# Patient Record
Sex: Male | Born: 1980 | Race: White | Hispanic: No | Marital: Single | Smoking: Current every day smoker
Health system: Southern US, Community
[De-identification: ages and names within clinical notes are randomized; demographics above are authoritative.]

## PROBLEM LIST (undated history)

## (undated) DIAGNOSIS — N200 Calculus of kidney: Secondary | ICD-10-CM

## (undated) DIAGNOSIS — K0889 Other specified disorders of teeth and supporting structures: Secondary | ICD-10-CM

## (undated) DIAGNOSIS — I10 Essential (primary) hypertension: Secondary | ICD-10-CM

## (undated) DIAGNOSIS — K279 Peptic ulcer, site unspecified, unspecified as acute or chronic, without hemorrhage or perforation: Secondary | ICD-10-CM

---

## 2000-12-24 ENCOUNTER — Emergency Department (HOSPITAL_COMMUNITY): Admission: EM | Admit: 2000-12-24 | Discharge: 2000-12-24 | Payer: Self-pay | Admitting: Emergency Medicine

## 2001-03-19 ENCOUNTER — Emergency Department (HOSPITAL_COMMUNITY): Admission: EM | Admit: 2001-03-19 | Discharge: 2001-03-19 | Payer: Self-pay | Admitting: Emergency Medicine

## 2001-03-27 ENCOUNTER — Emergency Department (HOSPITAL_COMMUNITY): Admission: EM | Admit: 2001-03-27 | Discharge: 2001-03-27 | Payer: Self-pay | Admitting: *Deleted

## 2001-04-02 ENCOUNTER — Emergency Department (HOSPITAL_COMMUNITY): Admission: EM | Admit: 2001-04-02 | Discharge: 2001-04-02 | Payer: Self-pay | Admitting: *Deleted

## 2001-11-01 ENCOUNTER — Emergency Department (HOSPITAL_COMMUNITY): Admission: EM | Admit: 2001-11-01 | Discharge: 2001-11-01 | Payer: Self-pay | Admitting: Emergency Medicine

## 2002-06-01 ENCOUNTER — Emergency Department (HOSPITAL_COMMUNITY): Admission: EM | Admit: 2002-06-01 | Discharge: 2002-06-01 | Payer: Self-pay | Admitting: Emergency Medicine

## 2002-09-15 ENCOUNTER — Emergency Department (HOSPITAL_COMMUNITY): Admission: EM | Admit: 2002-09-15 | Discharge: 2002-09-15 | Payer: Self-pay | Admitting: Emergency Medicine

## 2002-09-15 ENCOUNTER — Encounter: Payer: Self-pay | Admitting: Emergency Medicine

## 2004-02-24 ENCOUNTER — Emergency Department (HOSPITAL_COMMUNITY): Admission: EM | Admit: 2004-02-24 | Discharge: 2004-02-24 | Payer: Self-pay | Admitting: Emergency Medicine

## 2004-08-30 ENCOUNTER — Emergency Department (HOSPITAL_COMMUNITY): Admission: EM | Admit: 2004-08-30 | Discharge: 2004-08-30 | Payer: Self-pay | Admitting: Emergency Medicine

## 2005-02-07 ENCOUNTER — Emergency Department (HOSPITAL_COMMUNITY): Admission: EM | Admit: 2005-02-07 | Discharge: 2005-02-07 | Payer: Self-pay | Admitting: Emergency Medicine

## 2005-04-01 ENCOUNTER — Emergency Department (HOSPITAL_COMMUNITY): Admission: EM | Admit: 2005-04-01 | Discharge: 2005-04-01 | Payer: Self-pay | Admitting: Emergency Medicine

## 2005-04-04 ENCOUNTER — Emergency Department (HOSPITAL_COMMUNITY): Admission: EM | Admit: 2005-04-04 | Discharge: 2005-04-04 | Payer: Self-pay | Admitting: Emergency Medicine

## 2005-05-03 ENCOUNTER — Emergency Department (HOSPITAL_COMMUNITY): Admission: EM | Admit: 2005-05-03 | Discharge: 2005-05-03 | Payer: Self-pay | Admitting: Emergency Medicine

## 2005-07-04 ENCOUNTER — Emergency Department (HOSPITAL_COMMUNITY): Admission: EM | Admit: 2005-07-04 | Discharge: 2005-07-04 | Payer: Self-pay | Admitting: Emergency Medicine

## 2005-09-10 ENCOUNTER — Emergency Department (HOSPITAL_COMMUNITY): Admission: EM | Admit: 2005-09-10 | Discharge: 2005-09-10 | Payer: Self-pay | Admitting: Emergency Medicine

## 2005-10-23 ENCOUNTER — Emergency Department (HOSPITAL_COMMUNITY): Admission: EM | Admit: 2005-10-23 | Discharge: 2005-10-24 | Payer: Self-pay | Admitting: Emergency Medicine

## 2006-01-02 ENCOUNTER — Emergency Department (HOSPITAL_COMMUNITY): Admission: EM | Admit: 2006-01-02 | Discharge: 2006-01-02 | Payer: Self-pay | Admitting: Emergency Medicine

## 2006-02-01 ENCOUNTER — Emergency Department (HOSPITAL_COMMUNITY): Admission: EM | Admit: 2006-02-01 | Discharge: 2006-02-01 | Payer: Self-pay | Admitting: Emergency Medicine

## 2006-03-05 ENCOUNTER — Emergency Department (HOSPITAL_COMMUNITY): Admission: EM | Admit: 2006-03-05 | Discharge: 2006-03-05 | Payer: Self-pay | Admitting: Emergency Medicine

## 2006-03-30 ENCOUNTER — Emergency Department (HOSPITAL_COMMUNITY): Admission: EM | Admit: 2006-03-30 | Discharge: 2006-03-30 | Payer: Self-pay | Admitting: Emergency Medicine

## 2006-04-25 ENCOUNTER — Emergency Department (HOSPITAL_COMMUNITY): Admission: EM | Admit: 2006-04-25 | Discharge: 2006-04-25 | Payer: Self-pay | Admitting: Emergency Medicine

## 2006-05-16 ENCOUNTER — Emergency Department (HOSPITAL_COMMUNITY): Admission: EM | Admit: 2006-05-16 | Discharge: 2006-05-16 | Payer: Self-pay | Admitting: Emergency Medicine

## 2006-06-09 ENCOUNTER — Emergency Department (HOSPITAL_COMMUNITY): Admission: EM | Admit: 2006-06-09 | Discharge: 2006-06-09 | Payer: Self-pay | Admitting: Emergency Medicine

## 2006-06-27 ENCOUNTER — Emergency Department (HOSPITAL_COMMUNITY): Admission: EM | Admit: 2006-06-27 | Discharge: 2006-06-28 | Payer: Self-pay | Admitting: Emergency Medicine

## 2006-07-09 ENCOUNTER — Emergency Department (HOSPITAL_COMMUNITY): Admission: EM | Admit: 2006-07-09 | Discharge: 2006-07-09 | Payer: Self-pay | Admitting: Emergency Medicine

## 2006-07-29 ENCOUNTER — Emergency Department (HOSPITAL_COMMUNITY): Admission: EM | Admit: 2006-07-29 | Discharge: 2006-07-29 | Payer: Self-pay | Admitting: Emergency Medicine

## 2006-08-22 ENCOUNTER — Emergency Department (HOSPITAL_COMMUNITY): Admission: EM | Admit: 2006-08-22 | Discharge: 2006-08-23 | Payer: Self-pay | Admitting: Emergency Medicine

## 2006-09-02 ENCOUNTER — Emergency Department (HOSPITAL_COMMUNITY): Admission: EM | Admit: 2006-09-02 | Discharge: 2006-09-02 | Payer: Self-pay | Admitting: Emergency Medicine

## 2006-11-23 ENCOUNTER — Emergency Department (HOSPITAL_COMMUNITY): Admission: EM | Admit: 2006-11-23 | Discharge: 2006-11-24 | Payer: Self-pay | Admitting: Emergency Medicine

## 2007-01-07 ENCOUNTER — Emergency Department (HOSPITAL_COMMUNITY): Admission: EM | Admit: 2007-01-07 | Discharge: 2007-01-07 | Payer: Self-pay | Admitting: Emergency Medicine

## 2007-01-23 ENCOUNTER — Emergency Department (HOSPITAL_COMMUNITY): Admission: EM | Admit: 2007-01-23 | Discharge: 2007-01-23 | Payer: Self-pay | Admitting: *Deleted

## 2007-03-01 ENCOUNTER — Emergency Department (HOSPITAL_COMMUNITY): Admission: EM | Admit: 2007-03-01 | Discharge: 2007-03-01 | Payer: Self-pay | Admitting: Emergency Medicine

## 2007-03-15 ENCOUNTER — Emergency Department (HOSPITAL_COMMUNITY): Admission: EM | Admit: 2007-03-15 | Discharge: 2007-03-15 | Payer: Self-pay | Admitting: Emergency Medicine

## 2007-04-02 ENCOUNTER — Emergency Department (HOSPITAL_COMMUNITY): Admission: EM | Admit: 2007-04-02 | Discharge: 2007-04-02 | Payer: Self-pay | Admitting: Emergency Medicine

## 2007-04-22 ENCOUNTER — Emergency Department (HOSPITAL_COMMUNITY): Admission: EM | Admit: 2007-04-22 | Discharge: 2007-04-22 | Payer: Self-pay | Admitting: Emergency Medicine

## 2007-06-21 ENCOUNTER — Emergency Department (HOSPITAL_COMMUNITY): Admission: EM | Admit: 2007-06-21 | Discharge: 2007-06-21 | Payer: Self-pay | Admitting: Emergency Medicine

## 2007-06-29 ENCOUNTER — Emergency Department (HOSPITAL_COMMUNITY): Admission: EM | Admit: 2007-06-29 | Discharge: 2007-06-29 | Payer: Self-pay | Admitting: Emergency Medicine

## 2007-08-11 ENCOUNTER — Emergency Department (HOSPITAL_COMMUNITY): Admission: EM | Admit: 2007-08-11 | Discharge: 2007-08-11 | Payer: Self-pay | Admitting: Emergency Medicine

## 2007-08-12 IMAGING — CR DG HIP (WITH OR WITHOUT PELVIS) 2-3V*L*
3 series · 3 of 3 positions shown · non-contrast
Comparison: none

CLINICAL DATA: Injury to left hip with pain. 
 LEFT HIP ? 3 VIEW:

[view not recorded (1 of 3)]
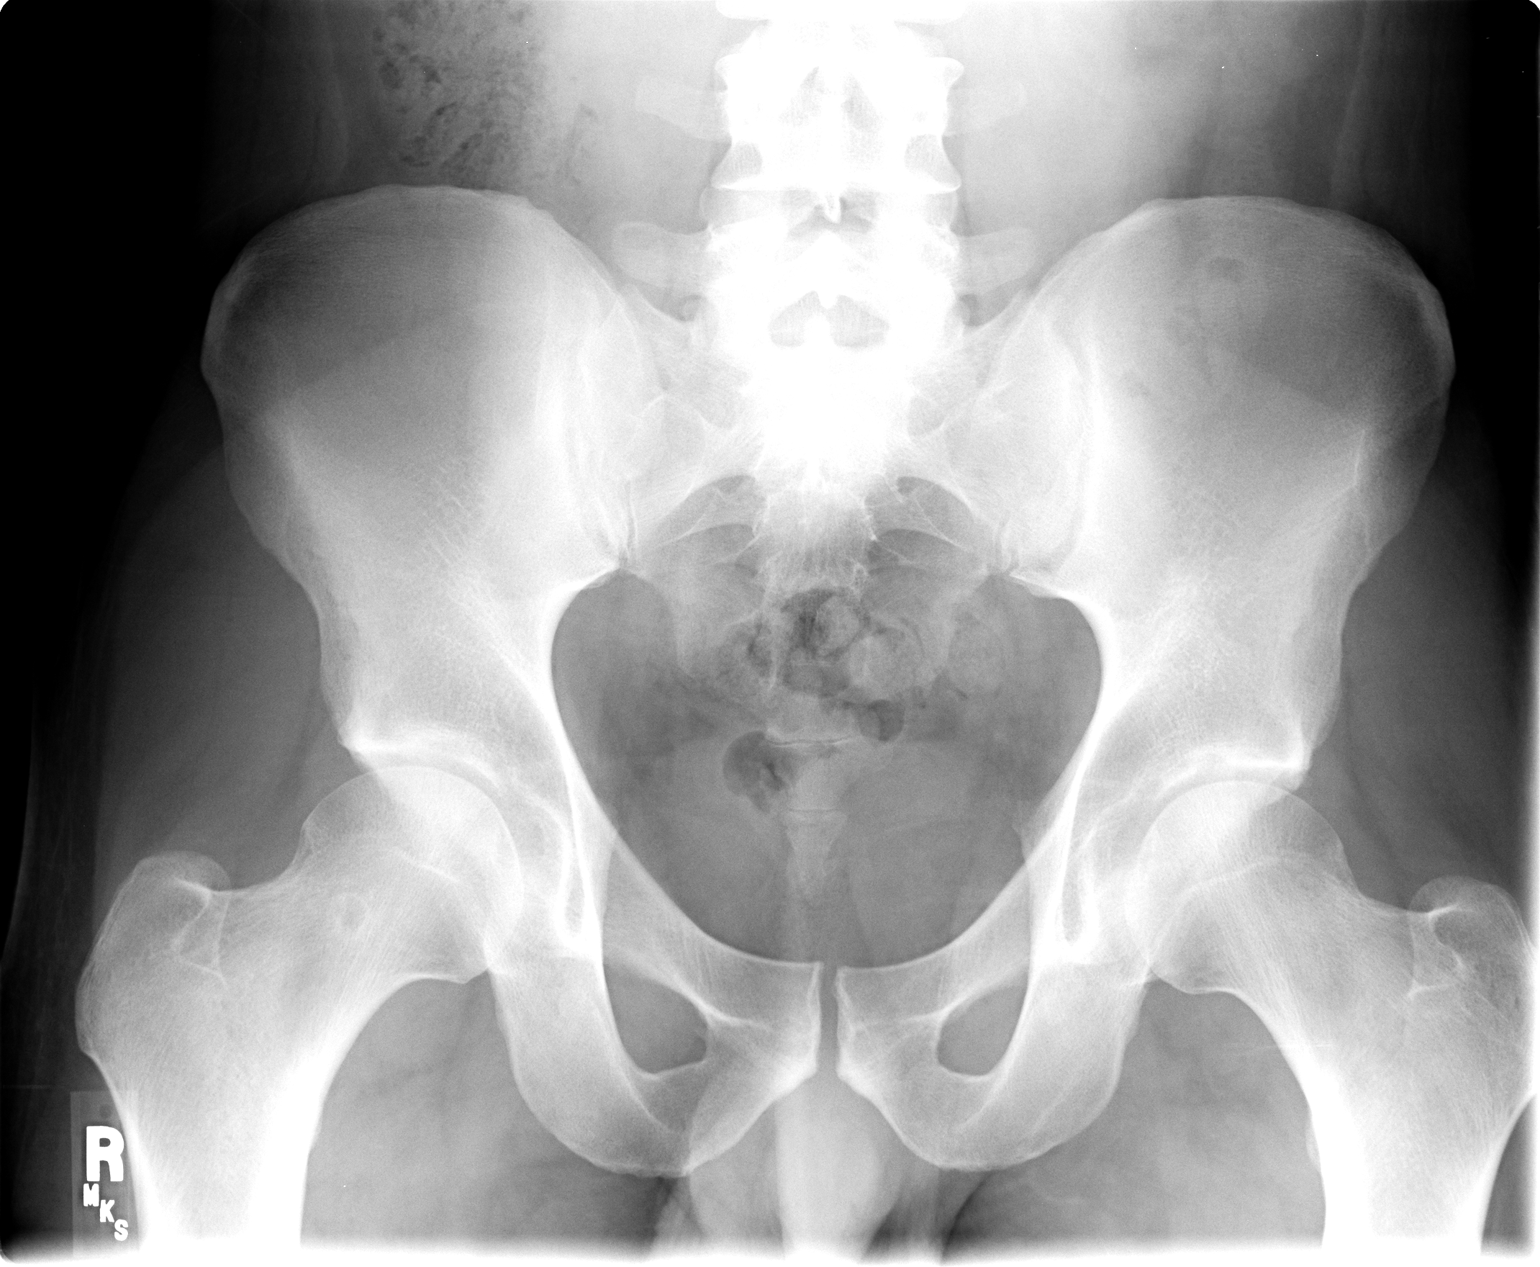

[view not recorded (2 of 3)]
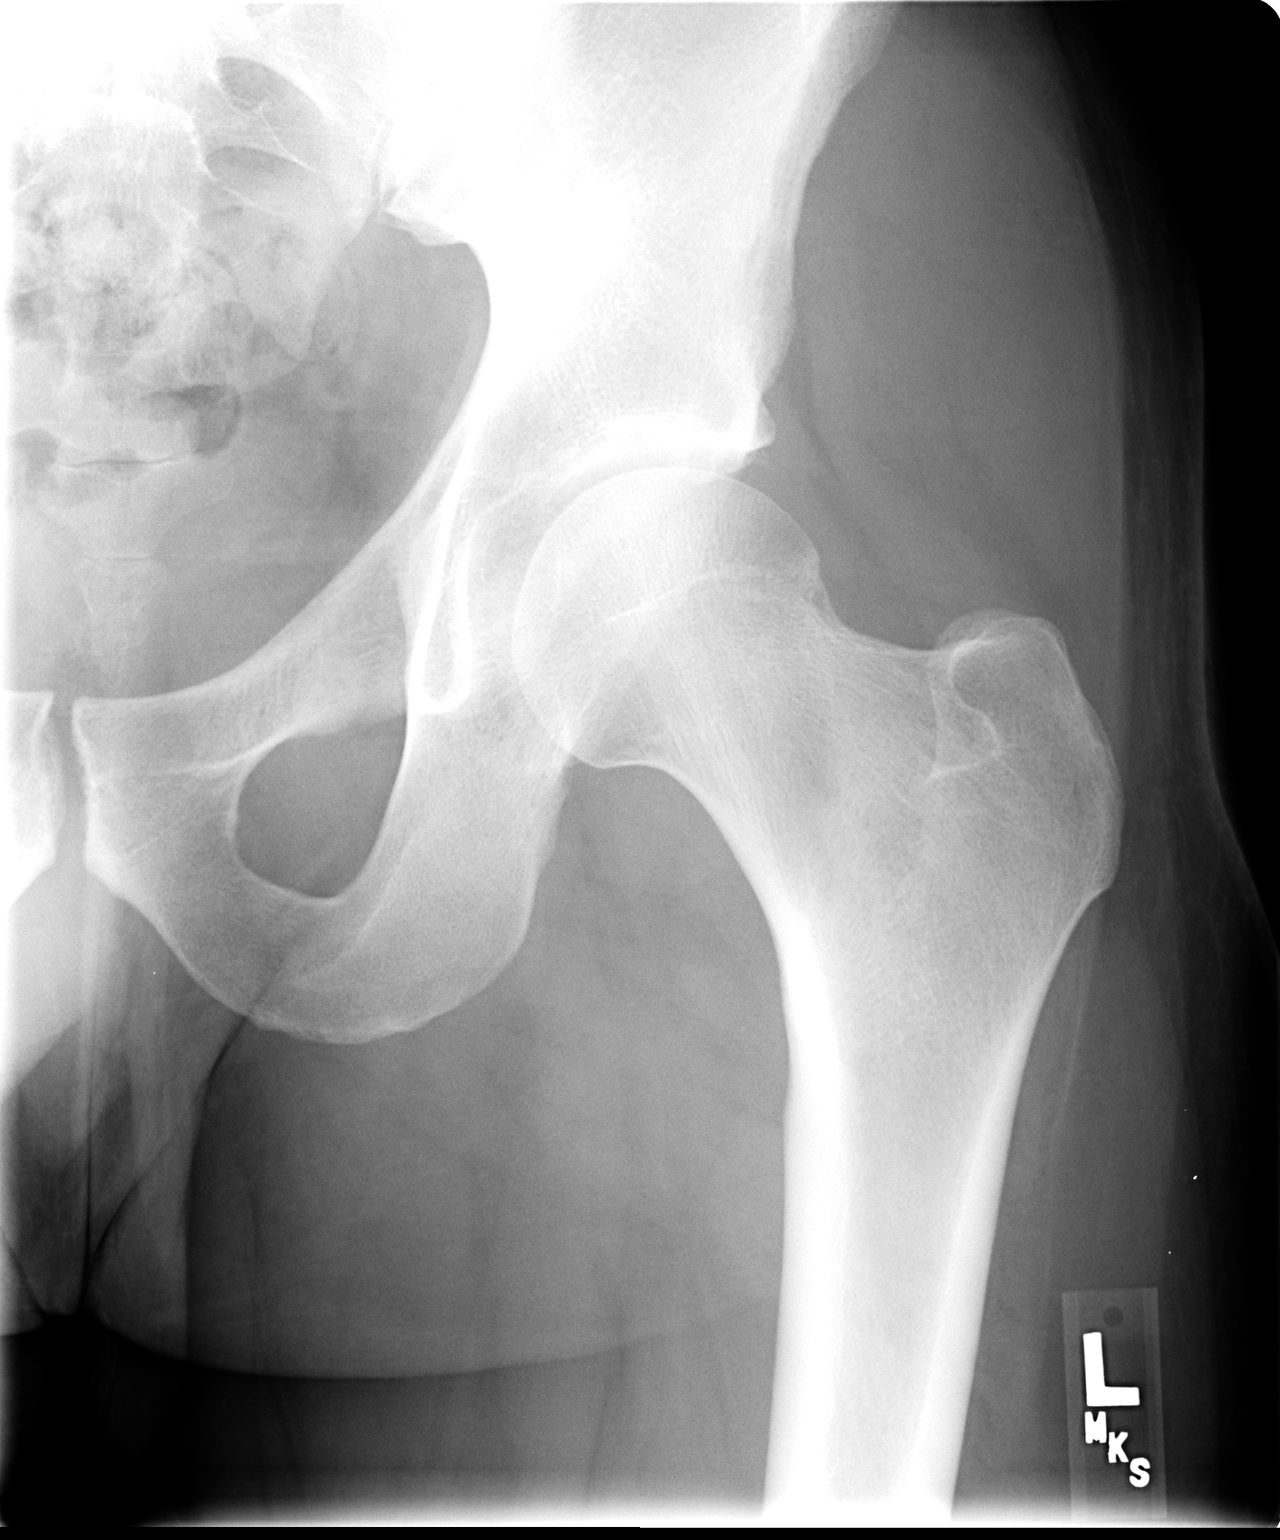

[view not recorded (3 of 3)]
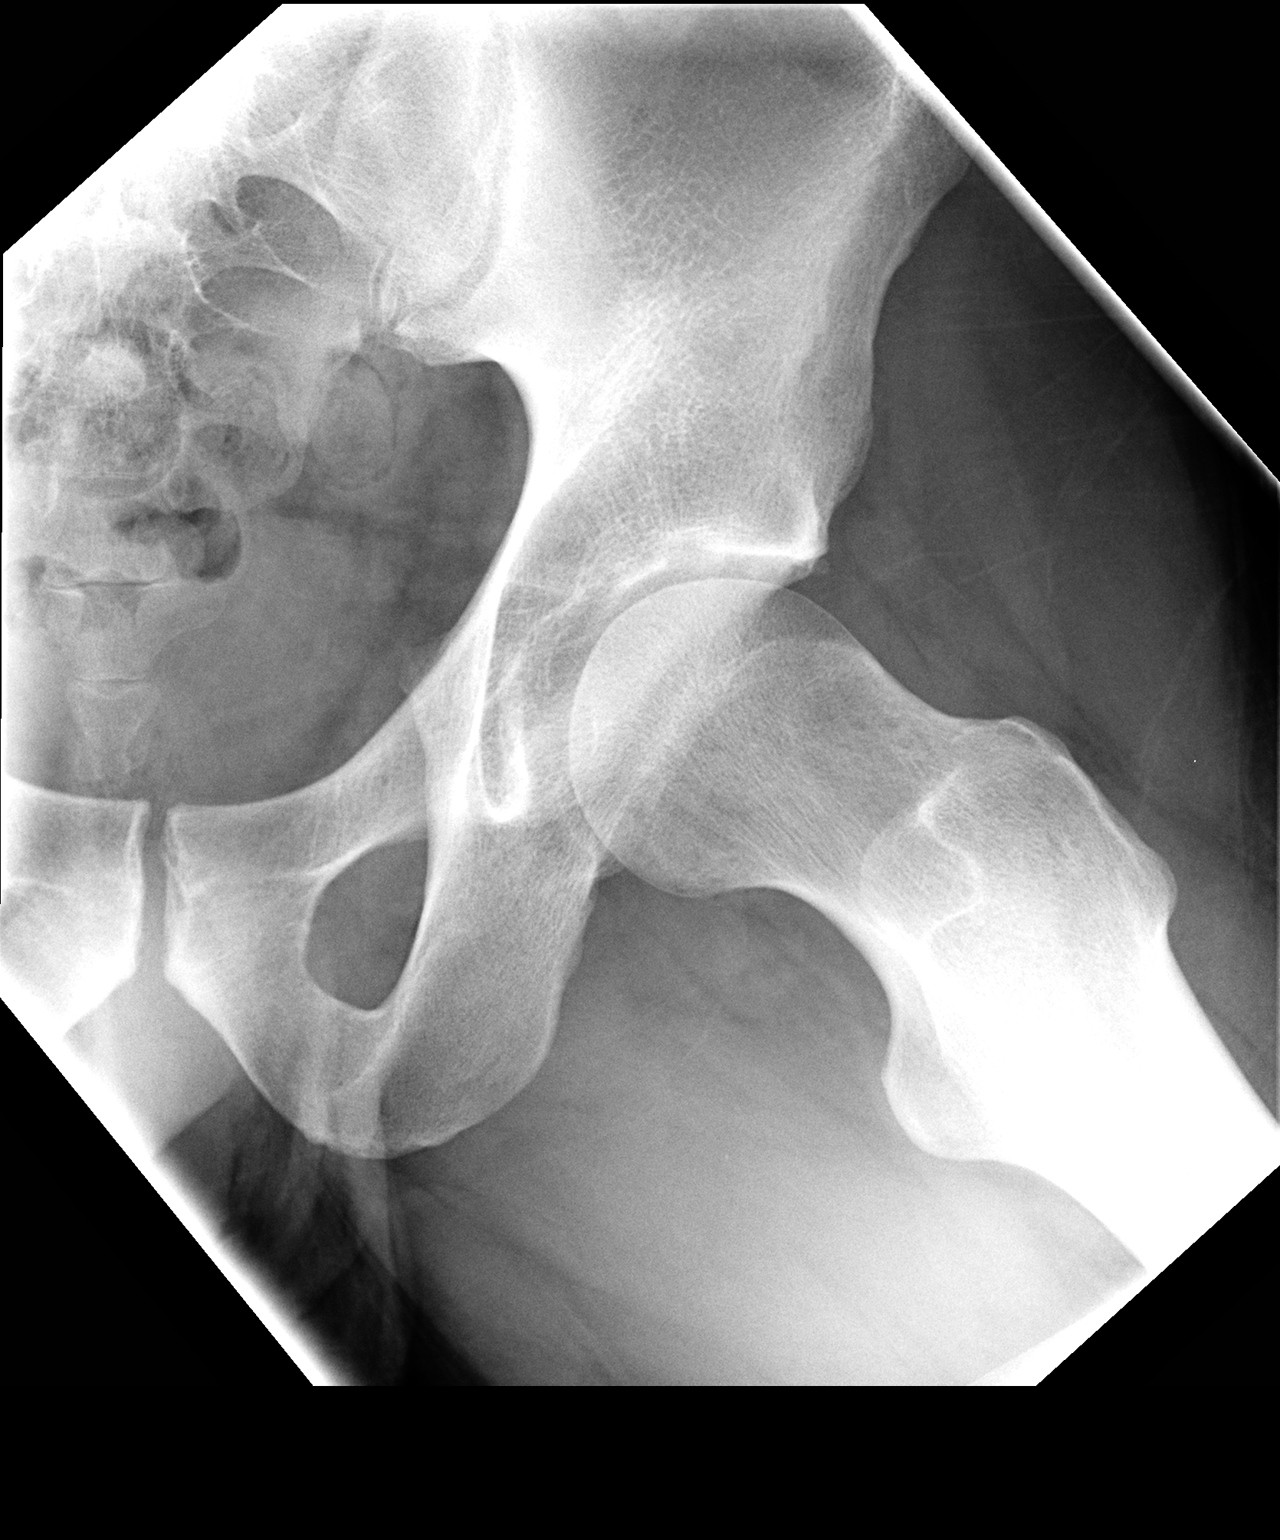

[3 of 3 positions shown; findings below may reference images not displayed]

FINDINGS: There is no evidence of hip fracture or dislocation.  There is no evidence of arthropathy or other focal bone abnormality.
IMPRESSION: Negative.

## 2007-11-07 ENCOUNTER — Emergency Department (HOSPITAL_COMMUNITY): Admission: EM | Admit: 2007-11-07 | Discharge: 2007-11-07 | Payer: Self-pay | Admitting: Emergency Medicine

## 2008-02-18 ENCOUNTER — Emergency Department (HOSPITAL_COMMUNITY): Admission: EM | Admit: 2008-02-18 | Discharge: 2008-02-18 | Payer: Self-pay | Admitting: Emergency Medicine

## 2008-02-21 ENCOUNTER — Emergency Department (HOSPITAL_COMMUNITY): Admission: EM | Admit: 2008-02-21 | Discharge: 2008-02-21 | Payer: Self-pay | Admitting: Emergency Medicine

## 2008-02-28 ENCOUNTER — Emergency Department (HOSPITAL_COMMUNITY): Admission: EM | Admit: 2008-02-28 | Discharge: 2008-02-28 | Payer: Self-pay | Admitting: Emergency Medicine

## 2008-03-18 ENCOUNTER — Emergency Department (HOSPITAL_COMMUNITY): Admission: EM | Admit: 2008-03-18 | Discharge: 2008-03-18 | Payer: Self-pay | Admitting: Emergency Medicine

## 2008-04-22 ENCOUNTER — Emergency Department (HOSPITAL_COMMUNITY): Admission: EM | Admit: 2008-04-22 | Discharge: 2008-04-23 | Payer: Self-pay | Admitting: Emergency Medicine

## 2008-05-12 ENCOUNTER — Emergency Department (HOSPITAL_COMMUNITY): Admission: EM | Admit: 2008-05-12 | Discharge: 2008-05-12 | Payer: Self-pay | Admitting: Emergency Medicine

## 2008-06-08 ENCOUNTER — Emergency Department (HOSPITAL_COMMUNITY): Admission: EM | Admit: 2008-06-08 | Discharge: 2008-06-08 | Payer: Self-pay | Admitting: Emergency Medicine

## 2008-06-30 ENCOUNTER — Emergency Department (HOSPITAL_COMMUNITY): Admission: EM | Admit: 2008-06-30 | Discharge: 2008-06-30 | Payer: Self-pay | Admitting: Emergency Medicine

## 2008-08-17 ENCOUNTER — Emergency Department (HOSPITAL_COMMUNITY): Admission: EM | Admit: 2008-08-17 | Discharge: 2008-08-17 | Payer: Self-pay | Admitting: Emergency Medicine

## 2008-10-20 ENCOUNTER — Emergency Department (HOSPITAL_COMMUNITY): Admission: EM | Admit: 2008-10-20 | Discharge: 2008-10-20 | Payer: Self-pay | Admitting: Emergency Medicine

## 2008-11-30 ENCOUNTER — Emergency Department (HOSPITAL_COMMUNITY): Admission: EM | Admit: 2008-11-30 | Discharge: 2008-11-30 | Payer: Self-pay | Admitting: Emergency Medicine

## 2009-04-03 ENCOUNTER — Emergency Department (HOSPITAL_COMMUNITY): Admission: EM | Admit: 2009-04-03 | Discharge: 2009-04-03 | Payer: Self-pay | Admitting: Emergency Medicine

## 2009-08-16 ENCOUNTER — Emergency Department (HOSPITAL_COMMUNITY): Admission: EM | Admit: 2009-08-16 | Discharge: 2009-08-16 | Payer: Self-pay | Admitting: Emergency Medicine

## 2009-10-10 ENCOUNTER — Emergency Department (HOSPITAL_COMMUNITY): Admission: EM | Admit: 2009-10-10 | Discharge: 2009-10-10 | Payer: Self-pay | Admitting: Emergency Medicine

## 2009-10-20 ENCOUNTER — Emergency Department (HOSPITAL_COMMUNITY): Admission: EM | Admit: 2009-10-20 | Discharge: 2009-10-20 | Payer: Self-pay | Admitting: Pain Medicine

## 2009-10-20 ENCOUNTER — Encounter: Payer: Self-pay | Admitting: Orthopedic Surgery

## 2009-10-27 ENCOUNTER — Ambulatory Visit: Payer: Self-pay | Admitting: Orthopedic Surgery

## 2009-10-27 DIAGNOSIS — S838X9A Sprain of other specified parts of unspecified knee, initial encounter: Secondary | ICD-10-CM

## 2009-10-27 DIAGNOSIS — S86819A Strain of other muscle(s) and tendon(s) at lower leg level, unspecified leg, initial encounter: Secondary | ICD-10-CM

## 2009-11-17 ENCOUNTER — Ambulatory Visit: Payer: Self-pay | Admitting: Orthopedic Surgery

## 2009-12-20 ENCOUNTER — Ambulatory Visit: Payer: Self-pay | Admitting: Orthopedic Surgery

## 2010-01-11 ENCOUNTER — Encounter: Payer: Self-pay | Admitting: Orthopedic Surgery

## 2010-01-20 ENCOUNTER — Encounter (HOSPITAL_COMMUNITY): Admission: RE | Admit: 2010-01-20 | Payer: Self-pay | Admitting: Orthopedic Surgery

## 2010-02-02 ENCOUNTER — Emergency Department (HOSPITAL_COMMUNITY): Admission: EM | Admit: 2010-02-02 | Discharge: 2010-02-02 | Payer: Self-pay | Admitting: Emergency Medicine

## 2010-02-08 ENCOUNTER — Emergency Department (HOSPITAL_COMMUNITY): Admission: EM | Admit: 2010-02-08 | Discharge: 2010-02-08 | Payer: Self-pay | Admitting: Emergency Medicine

## 2010-03-14 ENCOUNTER — Emergency Department (HOSPITAL_COMMUNITY)
Admission: EM | Admit: 2010-03-14 | Discharge: 2010-03-14 | Payer: Self-pay | Source: Home / Self Care | Admitting: Emergency Medicine

## 2010-04-19 NOTE — Miscellaneous (Signed)
Summary: Rehabilitation Referral form  Rehabilitation Referral form   Imported By: Jacklynn Ganong 01/13/2010 07:55:00  _____________________________________________________________________  External Attachment:    Type:   Image     Comment:   External Document

## 2010-04-19 NOTE — Assessment & Plan Note (Signed)
Summary: [facility] FOL/UP/LT LEG INJURY/XR [facility] 10/20/09/SELF PAY/ADVISED $100...   Vital Signs:  Patient profile:   30 year old male Height:      72 inches Weight:      200 pounds Pulse rate:   68 / minute Resp:     16 per minute  Vitals Entered By: Fuller Canada MD (October 27, 2009 3:47 PM)  Visit Type:  new patient Referring Provider:  [facility] er Primary Provider:  na  CC:  left thigh pain.  History of Present Illness: I saw Jeremy Nichols in the office today for an initial visit.  He is a 30 years old man with the complaint of:  left thigh pain.  DOI 10/19/09 was playing baseball, was running and started having pain and swelling left thigh.  Xrays [facility] left femur 10/20/09.  Meds: Ibuprofen 400mg  and Flexeril.  Patient presents in severe pain with sharp stabbing constant pain in this mid to upper thigh and swelling around his knee.  So far he's had some ibuprofen and Flexeril he is having very significant problems ambulating except with crutches and aching not relieved flex his knee well.    Allergies (verified): 1)  ! Ultram  Past History:  Past Medical History: na  Past Surgical History: na  Family History: Family History of Arthritis  Social History: Patient is separated.  carpentry house remodeling smokes 1/2 ppd no alcohol 2 sodas per day 9th grade ed.  Review of Systems Constitutional:  Denies weight loss, weight gain, fever, chills, and fatigue. Cardiovascular:  Denies chest pain, palpitations, fainting, and murmurs. Respiratory:  Denies short of breath, wheezing, couch, tightness, pain on inspiration, and snoring . Gastrointestinal:  Denies heartburn, nausea, vomiting, diarrhea, constipation, and blood in your stools. Genitourinary:  Denies frequency, urgency, difficulty urinating, painful urination, flank pain, and bleeding in urine. Neurologic:  Denies numbness, tingling, unsteady gait, dizziness, tremors, and seizure. Musculoskeletal:  Complains of  swelling, instability, stiffness, redness, heat, and muscle pain; denies joint pain. Endocrine:  Denies excessive thirst, exessive urination, and heat or cold intolerance. Psychiatric:  Denies nervousness, depression, anxiety, and hallucinations. Skin:  Denies changes in the skin, poor healing, rash, itching, and redness. HEENT:  Denies blurred or double vision, eye pain, redness, and watering. Immunology:  Denies seasonal allergies, sinus problems, and allergic to bee stings. Hemoatologic:  Denies easy bleeding and brusing.  Physical Exam  Additional Exam:  GEN: well developed, well nourished, normal grooming and hygiene, no deformity and normal body habitus.   CDV: pulses are normal, no edema, no erythema. no tenderness  Lymph: normal lymph nodes   Skin: no rashes, skin lesions or open sores   NEURO: normal coordination, reflexes, sensation.   Psyche: awake, alert and oriented. Mood normal   Gait: abnormals of significance support needed with crutches to ambulate  He has tenderness in his mid thigh with a mass there consistent with a muscle tear is tenderness proximally also near the anterior superior iliac spine there is swelling around the knee and is painful flexion passively and actively measuring only 60 there is no palpable defect at the quadriceps insertion.  He does have active extension and active elevation of the limb with a mild extensor lag       Impression & Recommendations:  Problem # 1:  SPRAIN&STRAIN OTHER SPECIFIED SITES KNEE&LEG (ICD-844.8) Assessment New  I think this can best be described as a extensor mechanism tear involving either the sartorius or rectus femoris more likely rectus femoris they saw  location.  Recommend supportive care.  Orders: New Patient Level III (89381)  Medications Added to Medication List This Visit: 1)  Ibuprofen 800 Mg Tabs (Ibuprofen) .Marland Kitchen.. 1 by mouth three times a day 2)  Norco 7.5-325 Mg Tabs (Hydrocodone-acetaminophen)  .Marland Kitchen.. 1 by mouth q 4 as needed pain 3)  Methocarbamol 500 Mg Tabs (Methocarbamol) .Marland Kitchen.. 1 q 6 as needed pain spasm  Patient Instructions: 1)  WEAR BRACE X 3 weeks and use the crutches  2)  Take 800 mg IBUPROFEN and 7.5 MG norco 3)  Apply heat 30 min three times a day to the thigh  4)  return in 3 weeks  Prescriptions: METHOCARBAMOL 500 MG TABS (METHOCARBAMOL) 1 q 6 as needed pain spasm  #28 x 2   Entered and Authorized by:   Fuller Canada MD   Signed by:   Fuller Canada MD on 10/27/2009   Method used:   Print then Give to Patient   RxID:   [id number] NORCO 7.5-325 MG TABS (HYDROCODONE-ACETAMINOPHEN) 1 by mouth q 4 as needed pain  #42 x 2   Entered and Authorized by:   Fuller Canada MD   Signed by:   Fuller Canada MD on 10/27/2009   Method used:   Print then Give to Patient   RxID:   [id number] IBUPROFEN 800 MG TABS (IBUPROFEN) 1 by mouth three times a day  #90 x 2   Entered and Authorized by:   Fuller Canada MD   Signed by:   Fuller Canada MD on 10/27/2009   Method used:   Print then Give to Patient   RxID:   [id number]

## 2010-04-19 NOTE — Assessment & Plan Note (Signed)
Summary: 3 WK RE-CK LT LEG/[facility] DISCOUNT/CAF   Visit Type:  Follow-up Referring Provider:  [facility] er Primary Provider:  na  CC:  recheck left leg.  History of Present Illness: 30 year old male injured his LEFT quadriceps most likely a rectus femoris or sartorius muscle tear  Date of injury was October 19, 2009  He has made some improvement still has some anterior quadriceps pain at 4 weeks  Xrays [facility] left femur 10/20/09. negative bony injury x-ray  Medications: 1)  Ibuprofen 800 Mg Tabs (Ibuprofen) .Marland Kitchen.. 1 by mouth three times a day 2)  Norco 7.5-325 Mg Tabs (Hydrocodone-acetaminophen) .Marland Kitchen.. 1 by mouth q 4 as needed pain 3)  Methocarbamol 500 Mg Tabs (Methocarbamol) .Marland Kitchen.. 1 q 6 as needed pain spasm  I last saw him 3 weeks ago his initial injury was August 2 as stated  It makes note that he is making improvements but is not quite 100% he still has some pain at the front of his thigh he would like his pain medicines were filled    Allergies: 1)  ! Ultram  Physical Exam  Additional Exam:  exam shows that he ambulates now without assistive device he has tenderness in the anterior portion of the thigh he has regained full range of motion in his hip and knee his skin is intact sensation is normal he is awake and alert there is weakness in hip flexion as well as knee extension     Impression & Recommendations:  Problem # 1:  SPRAIN&STRAIN OTHER SPECIFIED SITES KNEE&LEG (ICD-844.8) Assessment Improved he should now be able to go into a physical therapy program, all medications were rent in Orders: Physical Therapy Referral (PT) Est. Patient Level III (16109) d I'll see him again in 6 weeks all medicines were reviewed  Patient Instructions: 1)  Come back in 6 weeks 2)  take meds as prescibed 3)  go for therapy for 6 weeks Prescriptions: METHOCARBAMOL 500 MG TABS (METHOCARBAMOL) 1 q 6 as needed pain spasm  #90 x 1   Entered and Authorized by:   Fuller Canada MD   Signed by:    Fuller Canada MD on 12/20/2009   Method used:   Print then Give to Patient   RxID:   [id number] NORCO 7.5-325 MG TABS (HYDROCODONE-ACETAMINOPHEN) 1 by mouth q 4 as needed pain  #42 x 2   Entered and Authorized by:   Fuller Canada MD   Signed by:   Fuller Canada MD on 12/20/2009   Method used:   Print then Give to Patient   RxID:   [id number] IBUPROFEN 800 MG TABS (IBUPROFEN) 1 by mouth three times a day  #90 x 2   Entered and Authorized by:   Fuller Canada MD   Signed by:   Fuller Canada MD on 12/20/2009   Method used:   Print then Give to Patient   RxID:   [id number]

## 2010-04-19 NOTE — Letter (Signed)
Summary: History form  History form   Imported By: Jacklynn Ganong 11/02/2009 11:52:02  _____________________________________________________________________  External Attachment:    Type:   Image     Comment:   External Document

## 2010-04-19 NOTE — Assessment & Plan Note (Signed)
Summary: 3 WK RE-CK/LT LEG INJURY 10/20/09/SELF PAY VS.[facility]   Visit Type:  Follow-up Referring Provider:  ap er Primary Provider:  na  CC:  left leg.  History of Present Illness: 30 year old male injured his LEFT quadriceps most likely a rectus femoris or sartorius muscle tear  Date of injury was October 19, 2009  He has made some improvement still has some anterior quadriceps pain at 4 weeks  Xrays [facility] left femur 10/20/09. negative bony injury x-ray  Medications: 1)  Ibuprofen 800 Mg Tabs (Ibuprofen) .Marland Kitchen.. 1 by mouth three times a day 2)  Norco 7.5-325 Mg Tabs (Hydrocodone-acetaminophen) .Marland Kitchen.. 1 by mouth q 4 as needed pain 3)  Methocarbamol 500 Mg Tabs (Methocarbamol) .Marland Kitchen.. 1 q 6 as needed pain spasm     Allergies: 1)  ! Ultram  Physical Exam  Additional Exam:  left eye still tender but not as much, swelling is down.  Range of motion LEFT knee 0-50.  LEFT knee is stable hip range of motion painful in flexion but full  No neurovascular deficits     Impression & Recommendations:  Problem # 1:  SPRAIN&STRAIN OTHER SPECIFIED SITES KNEE&LEG (ICD-844.8)  rectus femoris strain tear  Continue range of motion exercises and bracing patient is uninsured and cannot get a hinged knee brace  The back 3 weeks to possibly remove brace after examination  Orders: Est. Patient Level II (47829)  Patient Instructions: 1)  Continue meds 2)  come back in 3 weeks Prescriptions: METHOCARBAMOL 500 MG TABS (METHOCARBAMOL) 1 q 6 as needed pain spasm  #90 x 0   Entered and Authorized by:   Fuller Canada MD   Signed by:   Fuller Canada MD on 11/17/2009   Method used:   Print then Give to Patient   RxID:   [id number] NORCO 7.5-325 MG TABS (HYDROCODONE-ACETAMINOPHEN) 1 by mouth q 4 as needed pain  #42 x 2   Entered and Authorized by:   Fuller Canada MD   Signed by:   Fuller Canada MD on 11/17/2009   Method used:   Print then Give to Patient   RxID:    [id number]

## 2010-05-31 LAB — URINE MICROSCOPIC-ADD ON

## 2010-05-31 LAB — URINALYSIS, ROUTINE W REFLEX MICROSCOPIC
Leukocytes, UA: NEGATIVE
Nitrite: NEGATIVE
Nitrite: NEGATIVE
Protein, ur: NEGATIVE mg/dL
Urobilinogen, UA: 0.2 mg/dL (ref 0.0–1.0)
Urobilinogen, UA: 0.2 mg/dL (ref 0.0–1.0)

## 2010-08-02 NOTE — Consult Note (Signed)
NAME:  ADE, STMARIE                ACCOUNT NO.:  [id number]   MEDICAL RECORD NO.:  [id number]          PATIENT TYPE:  EMS   LOCATION:  ED                            FACILITY:  [facility]   PHYSICIAN:  J. Darreld Mclean, M.D. DATE OF BIRTH:  Mar 23, 1980   DATE OF CONSULTATION:  DATE OF DISCHARGE:  08/17/2008                                 CONSULTATION   The patient is a 30 year old man seen at the request of ER staff.   He was doing some lifting today and injured his right shoulder.  He has  got marked pain and tenderness over the right shoulder, has markedly  decreased range of motion, forward flexion is only about 60 degrees,  internal rotation is about 15 and external 15, abductions test at 40-45,  which is very minimal.  Neurovascular is intact.  Extension is  approximately 10 degrees.  Adduction is limited to 30.  Grips are  normal.  Reflexes are normal.  It is very tender at the [facility] joint.  X-  rays of the shoulder are negative.  Range of motion of the neck is full.  There is no paresthesias.  There is some tightness on the right upper  trapezius.  Other extremities are negative.  He has a history of chronic  back pain and chronic sinus problems.  Back is negative today.  Vital  signs are normal.  Rest of the exam is negative.  Please refer to the ER  report established by the physician assistant here and cooperated by  reference, I have reviewed it.   IMPRESSION:  Acute right shoulder pain, AC sprain on the right.   He may have a rotator cuff, it is too early to say.  Right now he needs  elevation, ice, sling, and something for pain.  Prescriptions given for  Vicodin ES for pain.  I will see him in the office on Wednesday.  Use  the shoulder immobilizer.  Sleep semi erect.  Use ice.  Call if any  difficulty.  Return if any problem.  I will see him in the office on  Wednesday morning.           ______________________________  J. Darreld Mclean, M.D.     JWK/MEDQ  D:  08/17/2008   T:  08/18/2008  Job:  [id number]

## 2010-08-29 ENCOUNTER — Emergency Department (HOSPITAL_COMMUNITY)
Admission: EM | Admit: 2010-08-29 | Discharge: 2010-08-29 | Disposition: A | Discharge status: Home / Self Care | Payer: Self-pay | Attending: Emergency Medicine | Admitting: Emergency Medicine

## 2010-08-29 DIAGNOSIS — H6690 Otitis media, unspecified, unspecified ear: Secondary | ICD-10-CM | POA: Insufficient documentation

## 2010-09-15 ENCOUNTER — Emergency Department (HOSPITAL_COMMUNITY): Payer: Self-pay

## 2010-09-15 ENCOUNTER — Emergency Department (HOSPITAL_COMMUNITY)
Admission: EM | Admit: 2010-09-15 | Discharge: 2010-09-15 | Disposition: A | Discharge status: Home / Self Care | Payer: Self-pay | Attending: Emergency Medicine | Admitting: Emergency Medicine

## 2010-09-15 DIAGNOSIS — N201 Calculus of ureter: Secondary | ICD-10-CM | POA: Insufficient documentation

## 2010-09-15 DIAGNOSIS — R109 Unspecified abdominal pain: Secondary | ICD-10-CM | POA: Insufficient documentation

## 2010-09-15 LAB — URINALYSIS, ROUTINE W REFLEX MICROSCOPIC
Protein, ur: 100 mg/dL — AB
Urobilinogen, UA: 1 mg/dL (ref 0.0–1.0)

## 2010-09-15 LAB — URINE MICROSCOPIC-ADD ON

## 2010-09-17 LAB — URINE CULTURE: Colony Count: 10000

## 2010-12-14 LAB — URINALYSIS, ROUTINE W REFLEX MICROSCOPIC
Bilirubin Urine: NEGATIVE
Nitrite: NEGATIVE
Protein, ur: NEGATIVE
Specific Gravity, Urine: 1.02
Urobilinogen, UA: 0.2

## 2011-01-16 ENCOUNTER — Encounter: Payer: Self-pay | Admitting: *Deleted

## 2011-01-16 DIAGNOSIS — R109 Unspecified abdominal pain: Secondary | ICD-10-CM | POA: Insufficient documentation

## 2011-01-16 DIAGNOSIS — Z87442 Personal history of urinary calculi: Secondary | ICD-10-CM | POA: Insufficient documentation

## 2011-01-16 DIAGNOSIS — F17200 Nicotine dependence, unspecified, uncomplicated: Secondary | ICD-10-CM | POA: Insufficient documentation

## 2011-01-16 NOTE — ED Notes (Signed)
Pain in right lower quadrant 

## 2011-01-17 ENCOUNTER — Emergency Department (HOSPITAL_COMMUNITY)
Admission: EM | Admit: 2011-01-17 | Discharge: 2011-01-17 | Disposition: A | Discharge status: Home / Self Care | Payer: Self-pay | Attending: Emergency Medicine | Admitting: Emergency Medicine

## 2011-01-17 DIAGNOSIS — R109 Unspecified abdominal pain: Secondary | ICD-10-CM

## 2011-01-17 HISTORY — DX: Calculus of kidney: N20.0

## 2011-01-17 LAB — URINALYSIS, ROUTINE W REFLEX MICROSCOPIC
Bilirubin Urine: NEGATIVE
Glucose, UA: NEGATIVE mg/dL
Hgb urine dipstick: NEGATIVE
Ketones, ur: NEGATIVE mg/dL
Protein, ur: NEGATIVE mg/dL
Urobilinogen, UA: 0.2 mg/dL (ref 0.0–1.0)

## 2011-01-17 MED ORDER — HYDROCODONE-ACETAMINOPHEN 5-325 MG PO TABS: 1.0000 | ORAL_TABLET | ORAL | Status: AC | PRN

## 2011-01-17 MED ORDER — OXYCODONE-ACETAMINOPHEN 5-325 MG PO TABS
2.0000 | ORAL_TABLET | Freq: Once | ORAL | Status: AC
  Administered 2011-01-17: 2 via ORAL
  Filled 2011-01-17: qty 2

## 2011-01-17 MED ORDER — ONDANSETRON HCL 4 MG PO TABS: 8.0000 mg | ORAL_TABLET | Freq: Three times a day (TID) | ORAL | Status: AC | PRN

## 2011-01-17 NOTE — ED Notes (Signed)
Pt reports rt-sided abd/groin pain that began yesterday evening - pt states he was resting when the pain began. Admits to hx of kidney stones and states this pain feels the same. Denies n/v, fever, or hematuria. Pt resting comfortably on bed in no acute distress, family at bedside.

## 2011-01-17 NOTE — ED Provider Notes (Signed)
History     CSN: [id number] Arrival date & time: 01/17/2011 12:12 AM   First MD Initiated Contact with Patient 01/17/11 0014      Chief Complaint  Patient presents with  . Flank Pain    (Consider location/radiation/quality/duration/timing/severity/associated sxs/prior treatment) Patient is a 30 y.o. male presenting with flank pain. The history is provided by the patient.  Flank Pain The problem occurs hourly. Pertinent negatives include no chest pain. The symptoms are aggravated by nothing. The symptoms are relieved by nothing. He has tried nothing for the symptoms.   reports the pain is waxing and waning.  He reports it radiates from his right flank to his right groin and is reminiscent of his prior kidney stones.  His are that it'll pass his kidney stones on his own and has never required urologic followup or procedures.  Denies urinary symptoms at this time and complains of no hematuria.  His headache fevers or chills.  Pain is severe in nature.  He does report an allergy to tramadol.  His had several CT scans in the past demonstrating both ureterolithiasis and nephrolithiasis.  Past Medical History  Diagnosis Date  . Kidney stones   . Kidney stones   . Kidney stones     History reviewed. No pertinent past surgical history.  No family history on file.  History  Substance Use Topics  . Smoking status: Current Everyday Smoker -- 1.0 packs/day  . Smokeless tobacco: Not on file  . Alcohol Use: No      Review of Systems  Cardiovascular: Negative for chest pain.  Genitourinary: Positive for flank pain.  All other systems reviewed and are negative.    Allergies  Tramadol hcl  Home Medications   Current Outpatient Rx  Name Route Sig Dispense Refill  . RANITIDINE HCL 75 MG PO TABS Oral Take 75 mg by mouth daily.      Marland Kitchen HYDROCODONE-ACETAMINOPHEN 5-325 MG PO TABS Oral Take 1 tablet by mouth every 4 (four) hours as needed for pain. 10 tablet 0  . ONDANSETRON HCL 4 MG PO  TABS Oral Take 2 tablets (8 mg total) by mouth every 8 (eight) hours as needed for nausea. 12 tablet 0    BP 154/82  Pulse 103  Temp(Src) 98 F (36.7 C) (Oral)  Resp 20  Ht 6' (1.829 m)  Wt 215 lb (97.523 kg)  BMI 29.16 kg/m2  SpO2 98%  Physical Exam  Nursing note and vitals reviewed. Constitutional: He is oriented to person, place, and time. He appears well-developed and well-nourished.  HENT:  Head: Normocephalic and atraumatic.  Eyes: EOM are normal.  Neck: Normal range of motion.  Cardiovascular: Normal rate, regular rhythm, normal heart sounds and intact distal pulses.   Pulmonary/Chest: Effort normal and breath sounds normal. No respiratory distress.  Abdominal: Soft. He exhibits no distension. There is no tenderness.       No CVA tenderness  Musculoskeletal: Normal range of motion.  Neurological: He is alert and oriented to person, place, and time.  Skin: Skin is warm and dry.  Psychiatric: He has a normal mood and affect. Judgment normal.    ED Course  Procedures (including critical care time)  Labs Reviewed  URINALYSIS, ROUTINE W REFLEX MICROSCOPIC - Abnormal; Notable for the following:    Color, Urine AMBER (*) BIOCHEMICALS MAY BE AFFECTED BY COLOR   All other components within normal limits   No results found.   1. Flank pain       MDM  This may represent ureterolithiasis.  Urine demonstrates no evidence of infection.  Patient's been given pain medicine the ER you'll be discharged home with a total of 10 hydrocodone.  Bentyl to return to the ER for worsening abdominal pain high fevers or severe nausea or vomiting  Prior CT scans as well as emergency department visits reviewed        Lyanne Co, MD 01/17/11 0205

## 2011-01-17 NOTE — ED Notes (Signed)
Rx given x2 D/c instructions reviewed w/ pt and family - pt and family deny any further questions or concerns at present.  

## 2011-01-30 ENCOUNTER — Emergency Department (HOSPITAL_COMMUNITY)
Admission: EM | Admit: 2011-01-30 | Discharge: 2011-01-30 | Disposition: A | Discharge status: Home / Self Care | Payer: Self-pay | Attending: Emergency Medicine | Admitting: Emergency Medicine

## 2011-01-30 ENCOUNTER — Encounter (HOSPITAL_COMMUNITY): Payer: Self-pay

## 2011-01-30 DIAGNOSIS — I10 Essential (primary) hypertension: Secondary | ICD-10-CM | POA: Insufficient documentation

## 2011-01-30 DIAGNOSIS — F17200 Nicotine dependence, unspecified, uncomplicated: Secondary | ICD-10-CM | POA: Insufficient documentation

## 2011-01-30 DIAGNOSIS — J039 Acute tonsillitis, unspecified: Secondary | ICD-10-CM | POA: Insufficient documentation

## 2011-01-30 DIAGNOSIS — Z87442 Personal history of urinary calculi: Secondary | ICD-10-CM | POA: Insufficient documentation

## 2011-01-30 LAB — RAPID STREP SCREEN (MED CTR MEBANE ONLY): Streptococcus, Group A Screen (Direct): NEGATIVE

## 2011-01-30 MED ORDER — AMOXICILLIN 500 MG PO CAPS: 500.0000 mg | ORAL_CAPSULE | Freq: Three times a day (TID) | ORAL | Status: AC

## 2011-01-30 MED ORDER — LIDOCAINE VISCOUS 2 % MT SOLN
20.0000 mL | Freq: Once | OROMUCOSAL | Status: AC
  Administered 2011-01-30: 20 mL via OROMUCOSAL
  Filled 2011-01-30: qty 15

## 2011-01-30 MED ORDER — LIDOCAINE VISCOUS 2 % MT SOLN: 20.0000 mL | Freq: Once | OROMUCOSAL | Status: AC

## 2011-01-30 MED ORDER — AMOXICILLIN 250 MG PO CAPS
500.0000 mg | ORAL_CAPSULE | Freq: Once | ORAL | Status: AC
  Administered 2011-01-30: 500 mg via ORAL
  Filled 2011-01-30: qty 2

## 2011-01-30 NOTE — ED Notes (Signed)
C/o sore throat and ear ache.

## 2011-01-30 NOTE — ED Provider Notes (Signed)
Medical screening examination/treatment/procedure(s) were performed by non-physician practitioner and as supervising physician I was immediately available for consultation/collaboration.   Jakhari Space L Alecsander Hattabaugh, MD 01/30/11 2320 

## 2011-01-30 NOTE — ED Notes (Signed)
Pt a/ox4. resp even and unlabored. NAD at this time. D/Cinstructions reviewed with pt. Pt verbalized understanding. Pt ambulated to POV with steady gate.  

## 2011-01-30 NOTE — ED Provider Notes (Signed)
History     CSN: [id number] Arrival date & time: 01/30/2011  7:49 PM   First MD Initiated Contact with Patient 01/30/11 1953      Chief Complaint  Patient presents with  . Sore Throat    (Consider location/radiation/quality/duration/timing/severity/associated sxs/prior treatment) Patient is a 30 y.o. male presenting with pharyngitis. The history is provided by the patient.  Sore Throat This is a new problem. The current episode started in the past 7 days. The problem occurs constantly. The problem has been unchanged. Associated symptoms include a fever, a sore throat and swollen glands. Pertinent negatives include no abdominal pain, arthralgias, chest pain, congestion, headaches, joint swelling, nausea, neck pain, numbness, rash or weakness. The symptoms are aggravated by swallowing. Treatments tried: He took one amoxil tablet yesterday,  and 1 day,  left over from a previous infeciton. The treatment provided no relief.    Past Medical History  Diagnosis Date  . Kidney stones   . Kidney stones   . Kidney stones     History reviewed. No pertinent past surgical history.  No family history on file.  History  Substance Use Topics  . Smoking status: Current Everyday Smoker -- 1.0 packs/day  . Smokeless tobacco: Not on file  . Alcohol Use: No      Review of Systems  Constitutional: Positive for fever.  HENT: Positive for ear pain, sore throat and trouble swallowing. Negative for congestion, neck pain and voice change.   Eyes: Negative.   Respiratory: Negative for chest tightness and shortness of breath.   Cardiovascular: Negative for chest pain.  Gastrointestinal: Negative for nausea and abdominal pain.  Genitourinary: Negative.   Musculoskeletal: Negative for joint swelling and arthralgias.  Skin: Negative.  Negative for rash and wound.  Neurological: Negative for dizziness, weakness, light-headedness, numbness and headaches.  Hematological: Negative.     Psychiatric/Behavioral: Negative.     Allergies  Tramadol hcl  Home Medications   Current Outpatient Rx  Name Route Sig Dispense Refill  . AMOXICILLIN 500 MG PO CAPS Oral Take 500 mg by mouth once. For cold symptoms     . OVER THE COUNTER MEDICATION Oral Take 2 tablets by mouth 3 (three) times daily as needed. Tylenol Cold and Sinus     . RANITIDINE HCL 75 MG PO TABS Oral Take 75 mg by mouth daily.        BP 165/98  Pulse 104  Temp(Src) 97.9 F (36.6 C) (Oral)  Resp 18  Ht 6' (1.829 m)  Wt 200 lb (90.719 kg)  BMI 27.12 kg/m2  SpO2 99%  Physical Exam  Nursing note and vitals reviewed. Constitutional: He is oriented to person, place, and time. He appears well-developed and well-nourished.  HENT:  Head: Normocephalic and atraumatic.  Right Ear: Tympanic membrane, external ear and ear canal normal.  Left Ear: Tympanic membrane, external ear and ear canal normal.  Nose: Nose normal.  Mouth/Throat: Uvula is midline and mucous membranes are normal. Posterior oropharyngeal erythema present. No oropharyngeal exudate, posterior oropharyngeal edema or tonsillar abscesses.       Left tonsil with greater erythema than right,  Hypertrophied.  No peritonsilar swelling.  No exudate.  Eyes: Conjunctivae are normal.  Neck: Normal range of motion.  Cardiovascular: Normal rate, regular rhythm, normal heart sounds and intact distal pulses.   Pulmonary/Chest: Effort normal and breath sounds normal. He has no wheezes.  Abdominal: Soft. Bowel sounds are normal. There is no tenderness.  Musculoskeletal: Normal range of motion.  Neurological: He  is alert and oriented to person, place, and time.  Skin: Skin is warm and dry.  Psychiatric: He has a normal mood and affect.    ED Course  Procedures (including critical care time)   Labs Reviewed  RAPID STREP SCREEN   No results found.   No diagnosis found.    MDM  Although strep test is negative,  Will tx with abx given patient did  take amoxil yesterday and today,  Possibly causing false negative strep result.          Candis Musa, PA 01/30/11 2140

## 2011-10-21 ENCOUNTER — Encounter (HOSPITAL_COMMUNITY): Payer: Self-pay | Admitting: *Deleted

## 2011-10-21 ENCOUNTER — Emergency Department (HOSPITAL_COMMUNITY)
Admission: EM | Admit: 2011-10-21 | Discharge: 2011-10-21 | Disposition: A | Discharge status: Home / Self Care | Payer: Self-pay | Attending: Emergency Medicine | Admitting: Emergency Medicine

## 2011-10-21 DIAGNOSIS — F17200 Nicotine dependence, unspecified, uncomplicated: Secondary | ICD-10-CM | POA: Insufficient documentation

## 2011-10-21 DIAGNOSIS — K047 Periapical abscess without sinus: Secondary | ICD-10-CM | POA: Insufficient documentation

## 2011-10-21 HISTORY — DX: Essential (primary) hypertension: I10

## 2011-10-21 HISTORY — DX: Peptic ulcer, site unspecified, unspecified as acute or chronic, without hemorrhage or perforation: K27.9

## 2011-10-21 MED ORDER — AMOXICILLIN 250 MG PO CAPS
500.0000 mg | ORAL_CAPSULE | Freq: Once | ORAL | Status: AC
  Administered 2011-10-21: 500 mg via ORAL
  Filled 2011-10-21: qty 2

## 2011-10-21 MED ORDER — AMOXICILLIN 500 MG PO CAPS: 500.0000 mg | ORAL_CAPSULE | Freq: Three times a day (TID) | ORAL | Status: AC

## 2011-10-21 MED ORDER — HYDROCODONE-ACETAMINOPHEN 5-325 MG PO TABS
1.0000 | ORAL_TABLET | Freq: Once | ORAL | Status: AC
  Administered 2011-10-21: 1 via ORAL
  Filled 2011-10-21: qty 1

## 2011-10-21 MED ORDER — HYDROCODONE-ACETAMINOPHEN 5-325 MG PO TABS: 1.0000 | ORAL_TABLET | ORAL | Status: AC | PRN

## 2011-10-21 MED ORDER — IBUPROFEN 600 MG PO TABS: 600.0000 mg | ORAL_TABLET | Freq: Four times a day (QID) | ORAL | Status: AC | PRN

## 2011-10-21 NOTE — ED Notes (Signed)
J. Idol, PA at bedside. 

## 2011-10-21 NOTE — ED Notes (Signed)
Pt with left facial swelling due to dental decay noted to left and right lower jaw, states pain and swelling on left side of face started  2 days ago, denies fever, will provide pt with list of dental services in area due to pt recently moved back here in the area

## 2011-10-21 NOTE — ED Provider Notes (Signed)
Medical screening examination/treatment/procedure(s) were performed by non-physician practitioner and as supervising physician I was immediately available for consultation/collaboration.   Joya Gaskins, MD 10/21/11 1141

## 2011-10-21 NOTE — ED Provider Notes (Signed)
History     CSN: [id number]  Arrival date & time 10/21/11  1106   First MD Initiated Contact with Patient 10/21/11 1107      Chief Complaint  Patient presents with  . Oral Swelling    (Consider location/radiation/quality/duration/timing/severity/associated sxs/prior treatment) HPI Comments: Jeremy Nichols  presents with a 2 day history of dental pain and gingival swelling.   The patient has a history of injury to the tooth involved which has recently started to cause pain.  There has been no fevers,  Chills, nausea or vomiting, also no complaint of difficulty swallowing,  Although chewing makes pain worse.  The patient has tried tylenol and motrin without relief of symptoms.      The history is provided by the patient.    Past Medical History  Diagnosis Date  . Kidney stones   . Kidney stones   . Kidney stones   . Peptic ulcer   . Hypertension     History reviewed. No pertinent past surgical history.  No family history on file.  History  Substance Use Topics  . Smoking status: Current Everyday Smoker -- 1.0 packs/day  . Smokeless tobacco: Not on file  . Alcohol Use: No      Review of Systems  Constitutional: Negative for fever.  HENT: Positive for dental problem. Negative for sore throat, facial swelling, neck pain and neck stiffness.   Respiratory: Negative for shortness of breath.     Allergies  Tramadol hcl  Home Medications   Current Outpatient Rx  Name Route Sig Dispense Refill  . AMOXICILLIN 500 MG PO CAPS Oral Take 500 mg by mouth once. For cold symptoms     . AMOXICILLIN 500 MG PO CAPS Oral Take 1 capsule (500 mg total) by mouth 3 (three) times daily. 30 capsule 0  . HYDROCODONE-ACETAMINOPHEN 5-325 MG PO TABS Oral Take 1 tablet by mouth every 4 (four) hours as needed for pain. 15 tablet 0  . IBUPROFEN 600 MG PO TABS Oral Take 1 tablet (600 mg total) by mouth every 6 (six) hours as needed for pain. 20 tablet 0  . OVER THE COUNTER MEDICATION Oral Take  2 tablets by mouth 3 (three) times daily as needed. Tylenol Cold and Sinus     . RANITIDINE HCL 75 MG PO TABS Oral Take 75 mg by mouth daily.        BP 148/92  Pulse 94  Temp 98.1 F (36.7 C) (Oral)  Resp 16  SpO2 97%  Physical Exam  Constitutional: He is oriented to person, place, and time. He appears well-developed and well-nourished. No distress.  HENT:  Head: Normocephalic and atraumatic.  Right Ear: Tympanic membrane and external ear normal.  Left Ear: Tympanic membrane and external ear normal.  Mouth/Throat: Oropharynx is clear and moist and mucous membranes are normal. No oral lesions. Dental abscesses present.    Eyes: Conjunctivae are normal.  Neck: Normal range of motion. Neck supple.  Cardiovascular: Normal rate.   Pulmonary/Chest: Effort normal.  Musculoskeletal: Normal range of motion.  Lymphadenopathy:    He has no cervical adenopathy.  Neurological: He is alert and oriented to person, place, and time.  Skin: Skin is warm and dry. No erythema.  Psychiatric: He has a normal mood and affect.    ED Course  Procedures (including critical care time)  Labs Reviewed - No data to display No results found.   1. Dental abscess       MDM  Dental referrals given.  Amoxil,  Hydrocodone,  Ibuprofen.        [name], [place] 10/21/11 1131

## 2011-10-21 NOTE — ED Notes (Signed)
Pt states tooth pain and swelling to left gum x 2 days.

## 2011-11-12 ENCOUNTER — Encounter (HOSPITAL_COMMUNITY): Payer: Self-pay

## 2011-11-12 ENCOUNTER — Emergency Department (HOSPITAL_COMMUNITY)
Admission: EM | Admit: 2011-11-12 | Discharge: 2011-11-12 | Disposition: A | Discharge status: Home / Self Care | Payer: Self-pay | Attending: Emergency Medicine | Admitting: Emergency Medicine

## 2011-11-12 DIAGNOSIS — K089 Disorder of teeth and supporting structures, unspecified: Secondary | ICD-10-CM | POA: Insufficient documentation

## 2011-11-12 DIAGNOSIS — F17200 Nicotine dependence, unspecified, uncomplicated: Secondary | ICD-10-CM | POA: Insufficient documentation

## 2011-11-12 DIAGNOSIS — K0889 Other specified disorders of teeth and supporting structures: Secondary | ICD-10-CM

## 2011-11-12 MED ORDER — HYDROCODONE-ACETAMINOPHEN 5-325 MG PO TABS: 1.0000 | ORAL_TABLET | ORAL | Status: AC | PRN

## 2011-11-12 MED ORDER — MELOXICAM 7.5 MG PO TABS: ORAL_TABLET | ORAL | Status: DC

## 2011-11-12 MED ORDER — CEPHALEXIN 500 MG PO CAPS: 500.0000 mg | ORAL_CAPSULE | Freq: Four times a day (QID) | ORAL | Status: AC

## 2011-11-12 NOTE — ED Provider Notes (Signed)
Medical screening examination/treatment/procedure(s) were performed by non-physician practitioner and as supervising physician I was immediately available for consultation/collaboration.   Shelda Jakes, MD 11/12/11 [phone]

## 2011-11-12 NOTE — ED Provider Notes (Signed)
History     CSN: [id number]  Arrival date & time 11/12/11  1703   First MD Initiated Contact with Patient 11/12/11 1723      Chief Complaint  Patient presents with  . Dental Pain    (Consider location/radiation/quality/duration/timing/severity/associated sxs/prior treatment) Patient is a 31 y.o. male presenting with tooth pain. The history is provided by the patient.  Dental PainThe primary symptoms include mouth pain and headaches. Primary symptoms do not include shortness of breath or cough. The symptoms began 2 days ago. The symptoms are unchanged. The symptoms occur constantly.  The headache is not associated with photophobia.  Additional symptoms include: dental sensitivity to temperature, gum swelling and jaw pain. Additional symptoms do not include: nosebleeds. Medical issues include: smoking.    Past Medical History  Diagnosis Date  . Kidney stones   . Kidney stones   . Kidney stones   . Peptic ulcer   . Hypertension     History reviewed. No pertinent past surgical history.  No family history on file.  History  Substance Use Topics  . Smoking status: Current Everyday Smoker -- 1.0 packs/day    Types: Cigarettes  . Smokeless tobacco: Not on file  . Alcohol Use: No      Review of Systems  Constitutional: Negative for activity change.       All ROS Neg except as noted in HPI  HENT: Positive for dental problem. Negative for nosebleeds and neck pain.   Eyes: Negative for photophobia and discharge.  Respiratory: Negative for cough, shortness of breath and wheezing.   Cardiovascular: Negative for chest pain and palpitations.  Gastrointestinal: Positive for abdominal pain. Negative for blood in stool.  Genitourinary: Positive for flank pain. Negative for dysuria, frequency and hematuria.  Musculoskeletal: Negative for back pain and arthralgias.  Skin: Negative.   Neurological: Positive for headaches. Negative for dizziness, seizures and speech difficulty.    Psychiatric/Behavioral: Negative for hallucinations and confusion.    Allergies  Tramadol hcl  Home Medications   Current Outpatient Rx  Name Route Sig Dispense Refill  . AMOXICILLIN 500 MG PO CAPS Oral Take 500 mg by mouth once. For cold symptoms     . OVER THE COUNTER MEDICATION Oral Take 2 tablets by mouth 3 (three) times daily as needed. Tylenol Cold and Sinus     . RANITIDINE HCL 75 MG PO TABS Oral Take 75 mg by mouth daily.        BP 143/91  Pulse 106  Temp 98 F (36.7 C) (Oral)  Resp 20  Ht 6' (1.829 m)  Wt 195 lb (88.451 kg)  BMI 26.45 kg/m2  SpO2 100%  Physical Exam  Nursing note and vitals reviewed. Constitutional: He is oriented to person, place, and time. He appears well-developed and well-nourished.  Non-toxic appearance.  HENT:  Head: Normocephalic.  Right Ear: Tympanic membrane and external ear normal.  Left Ear: Tympanic membrane and external ear normal.       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 No visible abscess. The uvula is in the midline. The airway is patent. The speech is understandable. There is no swelling under the tongue.  Eyes: EOM and lids are normal. Pupils are equal, round, and reactive to light.  Neck: Normal range of motion. Neck supple. Carotid bruit is not present.  Cardiovascular: Regular rhythm, normal heart sounds, intact distal pulses and normal pulses.  Tachycardia present.   Pulmonary/Chest: Breath sounds normal. No respiratory distress.  Abdominal: Soft. Bowel sounds are normal. There is no tenderness. There is no guarding.  Musculoskeletal: Normal range of motion.  Lymphadenopathy:       Head (right side): No submandibular adenopathy present.       Head (left side): No submandibular adenopathy present.    He has no cervical adenopathy.  Neurological: He is alert and oriented to person, place, and time. He has normal strength. No cranial nerve deficit or sensory deficit.  Skin: Skin is warm and dry.   Psychiatric: He has a normal mood and affect. His speech is normal.    ED Course  Procedures (including critical care time)  Labs Reviewed - No data to display No results found.   No diagnosis found.    MDM  I have reviewed nursing notes, vital signs, and all appropriate lab and imaging results for this patient. Multiple dental caries of the right and left upper and lower jaw. Patient states that he has an appointment with a dentist scheduled in 2 weeks. Prescription for amoxicillin 2 times daily, Mobic 2 times daily, and Norco every 4 hours #15 tablets given to the patient.       Kathie Dike, [place] 11/12/11 1732

## 2011-11-12 NOTE — ED Notes (Signed)
Pt reports toothache to left side of mouth x2 days

## 2011-11-12 NOTE — ED Notes (Signed)
Pt presents with left lower tooth pain x 1 week. Small amount swelling seen in lower left jaw. Pt states has dental appointment next week. Encouraged to keep appt.

## 2012-02-07 ENCOUNTER — Emergency Department (HOSPITAL_COMMUNITY)
Admission: EM | Admit: 2012-02-07 | Discharge: 2012-02-07 | Disposition: A | Discharge status: Home / Self Care | Payer: Self-pay | Attending: Emergency Medicine | Admitting: Emergency Medicine

## 2012-02-07 ENCOUNTER — Encounter (HOSPITAL_COMMUNITY): Payer: Self-pay | Admitting: Emergency Medicine

## 2012-02-07 DIAGNOSIS — Z87442 Personal history of urinary calculi: Secondary | ICD-10-CM | POA: Insufficient documentation

## 2012-02-07 DIAGNOSIS — Y9389 Activity, other specified: Secondary | ICD-10-CM | POA: Insufficient documentation

## 2012-02-07 DIAGNOSIS — T148XXA Other injury of unspecified body region, initial encounter: Secondary | ICD-10-CM | POA: Insufficient documentation

## 2012-02-07 DIAGNOSIS — S61432A Puncture wound without foreign body of left hand, initial encounter: Secondary | ICD-10-CM

## 2012-02-07 DIAGNOSIS — Z8711 Personal history of peptic ulcer disease: Secondary | ICD-10-CM | POA: Insufficient documentation

## 2012-02-07 DIAGNOSIS — I10 Essential (primary) hypertension: Secondary | ICD-10-CM | POA: Insufficient documentation

## 2012-02-07 DIAGNOSIS — Z79899 Other long term (current) drug therapy: Secondary | ICD-10-CM | POA: Insufficient documentation

## 2012-02-07 DIAGNOSIS — X58XXXA Exposure to other specified factors, initial encounter: Secondary | ICD-10-CM | POA: Insufficient documentation

## 2012-02-07 DIAGNOSIS — Y92009 Unspecified place in unspecified non-institutional (private) residence as the place of occurrence of the external cause: Secondary | ICD-10-CM | POA: Insufficient documentation

## 2012-02-07 MED ORDER — OXYCODONE-ACETAMINOPHEN 5-325 MG PO TABS
2.0000 | ORAL_TABLET | Freq: Once | ORAL | Status: AC
  Administered 2012-02-07: 2 via ORAL
  Filled 2012-02-07: qty 2

## 2012-02-07 MED ORDER — AMOXICILLIN-POT CLAVULANATE 875-125 MG PO TABS: 1.0000 | ORAL_TABLET | Freq: Two times a day (BID) | ORAL | Status: DC

## 2012-02-07 MED ORDER — NAPROXEN 500 MG PO TABS: 500.0000 mg | ORAL_TABLET | Freq: Two times a day (BID) | ORAL | Status: DC

## 2012-02-07 MED ORDER — AMOXICILLIN-POT CLAVULANATE 875-125 MG PO TABS
1.0000 | ORAL_TABLET | Freq: Once | ORAL | Status: AC
  Administered 2012-02-07: 1 via ORAL
  Filled 2012-02-07: qty 1

## 2012-02-07 MED ORDER — OXYCODONE-ACETAMINOPHEN 5-325 MG PO TABS: 1.0000 | ORAL_TABLET | ORAL | Status: DC | PRN

## 2012-02-07 NOTE — ED Notes (Signed)
MD in to evaluate

## 2012-02-07 NOTE — ED Provider Notes (Signed)
History     CSN: [id number]  Arrival date & time 02/07/12  0244   First MD Initiated Contact with Patient 02/07/12 0253      Chief Complaint  Patient presents with  . Hand Injury    (Consider location/radiation/quality/duration/timing/severity/associated sxs/prior treatment) HPI Comments: The patient is up-to-date on tetanus, he has not had fever, it hurts worse with flexion and extension of the hand specifically the third and fourth fingers of the left hand.  Patient is a 31 y.o. male presenting with hand injury. The history is provided by the patient.  Hand Injury  The incident occurred 6 to 12 hours ago. The incident occurred at home. Injury mechanism: Puncture wound from the distal of a plant. Pain location: dorsum of the left hand. The quality of the pain is described as aching. The pain is moderate. The pain has been constant since the incident. Pertinent negatives include no fever. He reports no foreign bodies present. The symptoms are aggravated by palpation and movement. He has tried nothing for the symptoms.    Past Medical History  Diagnosis Date  . Kidney stones   . Kidney stones   . Kidney stones   . Peptic ulcer   . Hypertension     History reviewed. No pertinent past surgical history.  No family history on file.  History  Substance Use Topics  . Smoking status: Current Every Day Smoker -- 1.0 packs/day    Types: Cigarettes  . Smokeless tobacco: Not on file  . Alcohol Use: Yes     Comment: occasional      Review of Systems  Constitutional: Negative for fever.  Skin: Positive for wound. Negative for rash.  Neurological: Negative for numbness.    Allergies  Tramadol hcl  Home Medications   Current Outpatient Rx  Name  Route  Sig  Dispense  Refill  . AMOXICILLIN 500 MG PO CAPS   Oral   Take 500 mg by mouth once. For cold symptoms          . AMOXICILLIN-POT CLAVULANATE 875-125 MG PO TABS   Oral   Take 1 tablet by mouth every 12 (twelve)  hours.   14 tablet   0   . IBUPROFEN 200 MG PO TABS   Oral   Take 200 mg by mouth every 6 (six) hours as needed.         . MELOXICAM 7.5 MG PO TABS      1 po bid for swelling and inflammation   12 tablet   0   . NAPROXEN 500 MG PO TABS   Oral   Take 1 tablet (500 mg total) by mouth 2 (two) times daily with a meal.   30 tablet   0   . OVER THE COUNTER MEDICATION   Oral   Take 2 tablets by mouth 3 (three) times daily as needed. Tylenol Cold and Sinus          . OXYCODONE-ACETAMINOPHEN 5-325 MG PO TABS   Oral   Take 1 tablet by mouth every 4 (four) hours as needed for pain.   20 tablet   0   . RANITIDINE HCL 75 MG PO TABS   Oral   Take 75 mg by mouth daily.             BP 141/96  Pulse 80  Temp 98.4 F (36.9 C) (Oral)  Resp 18  Ht 6' (1.829 m)  Wt 195 lb (88.451 kg)  BMI 26.45 kg/m2  SpO2 96%  Physical Exam  Nursing note and vitals reviewed. Constitutional:       Well-appearing in no acute distress  HENT:       Normocephalic, atraumatic  Eyes:       Conjunctiva are clear, no scleral icterus  Cardiovascular:       Normal pulses at the radial artery on the left hand  Musculoskeletal: He exhibits tenderness.       Decreased range of motion of the hand to flexion and extension specifically at the third and fourth digits at the MCP. There is tenderness and swelling over the dorsum of the hand which is mild, localized to a 3 cm diameter. There is no redness, no warmth, no discharge from the puncture wound. There is no spreading redness up the arm  Neurological: He is alert. Coordination normal.       Normal sensation to light touch of all the fingers of the left hand  Skin: Skin is warm and dry.       Tiny puncture wound on the dorsum of the left hand, no other rash or redness    ED Course  Procedures (including critical care time)  Labs Reviewed - No data to display No results found.   1. Puncture wound of left hand       MDM  I performed a  bedside ultrasound looking for possible organic material or foreign bodies in the dorsum of the hand. There was no obvious foreign body seen. Radiography wouldn't give limited evaluation as it would not pick up organic material. The patient does not have a fever, he has very focal tenderness to the dorsum of the left hand and as he is within 12 hours of his injury, he is amenable to antibiotic therapy for prevention of infection, ice, elevation, anti-inflammatories and Percocet for severe pain. I have very clearly delineated the need for followup evaluation in the indications for return should his symptoms worsen and he has expressed his understanding.        Vida Roller, MD 02/07/12 [phone]

## 2012-02-07 NOTE — ED Notes (Signed)
Puncture wound to dorsum left hand.  States he stabbed a huckleberry thorn in his hand.  Tender to touch.  Mild swelling

## 2012-03-18 ENCOUNTER — Encounter (HOSPITAL_COMMUNITY): Payer: Self-pay | Admitting: Emergency Medicine

## 2012-03-18 ENCOUNTER — Emergency Department (HOSPITAL_COMMUNITY)
Admission: EM | Admit: 2012-03-18 | Discharge: 2012-03-18 | Disposition: A | Discharge status: Home / Self Care | Payer: Self-pay | Attending: Emergency Medicine | Admitting: Emergency Medicine

## 2012-03-18 DIAGNOSIS — K5900 Constipation, unspecified: Secondary | ICD-10-CM | POA: Insufficient documentation

## 2012-03-18 DIAGNOSIS — I10 Essential (primary) hypertension: Secondary | ICD-10-CM | POA: Insufficient documentation

## 2012-03-18 DIAGNOSIS — J3489 Other specified disorders of nose and nasal sinuses: Secondary | ICD-10-CM | POA: Insufficient documentation

## 2012-03-18 DIAGNOSIS — J069 Acute upper respiratory infection, unspecified: Secondary | ICD-10-CM | POA: Insufficient documentation

## 2012-03-18 DIAGNOSIS — K279 Peptic ulcer, site unspecified, unspecified as acute or chronic, without hemorrhage or perforation: Secondary | ICD-10-CM | POA: Insufficient documentation

## 2012-03-18 DIAGNOSIS — R509 Fever, unspecified: Secondary | ICD-10-CM | POA: Insufficient documentation

## 2012-03-18 DIAGNOSIS — R059 Cough, unspecified: Secondary | ICD-10-CM | POA: Insufficient documentation

## 2012-03-18 DIAGNOSIS — R1110 Vomiting, unspecified: Secondary | ICD-10-CM | POA: Insufficient documentation

## 2012-03-18 DIAGNOSIS — Z8711 Personal history of peptic ulcer disease: Secondary | ICD-10-CM | POA: Insufficient documentation

## 2012-03-18 DIAGNOSIS — R51 Headache: Secondary | ICD-10-CM | POA: Insufficient documentation

## 2012-03-18 DIAGNOSIS — Z87442 Personal history of urinary calculi: Secondary | ICD-10-CM | POA: Insufficient documentation

## 2012-03-18 DIAGNOSIS — R05 Cough: Secondary | ICD-10-CM | POA: Insufficient documentation

## 2012-03-18 DIAGNOSIS — Z79899 Other long term (current) drug therapy: Secondary | ICD-10-CM | POA: Insufficient documentation

## 2012-03-18 DIAGNOSIS — F17200 Nicotine dependence, unspecified, uncomplicated: Secondary | ICD-10-CM | POA: Insufficient documentation

## 2012-03-18 MED ORDER — PROMETHAZINE-DM 6.25-15 MG/5ML PO SYRP: ORAL_SOLUTION | ORAL | Status: DC

## 2012-03-18 NOTE — ED Notes (Signed)
Flu like symptoms for four days.

## 2012-03-18 NOTE — ED Notes (Signed)
Cough with green brown sputum, chills at times,  Throat sore.  Alert, body aches

## 2012-03-18 NOTE — ED Provider Notes (Signed)
History     CSN: [id number]  Arrival date & time 03/18/12  1404   First MD Initiated Contact with Patient 03/18/12 1611      Chief Complaint  Patient presents with  . Influenza    (Consider location/radiation/quality/duration/timing/severity/associated sxs/prior treatment) Patient is a 31 y.o. male presenting with flu symptoms. The history is provided by the patient.  Influenza This is a new problem. The current episode started in the past 7 days. The problem occurs constantly. The problem has been gradually worsening. Associated symptoms include arthralgias, a change in bowel habit, chills, congestion, coughing, a fever, headaches, myalgias and vomiting. Pertinent negatives include no abdominal pain, chest pain or neck pain. Nothing aggravates the symptoms. He has tried NSAIDs (tylenol PM) for the symptoms. The treatment provided mild relief.    Past Medical History  Diagnosis Date  . Kidney stones   . Kidney stones   . Kidney stones   . Peptic ulcer   . Hypertension     History reviewed. No pertinent past surgical history.  No family history on file.  History  Substance Use Topics  . Smoking status: Current Every Day Smoker -- 1.0 packs/day    Types: Cigarettes  . Smokeless tobacco: Not on file  . Alcohol Use: Yes     Comment: occasional      Review of Systems  Constitutional: Positive for fever and chills. Negative for activity change.       All ROS Neg except as noted in HPI  HENT: Positive for congestion. Negative for nosebleeds and neck pain.   Eyes: Negative for photophobia and discharge.  Respiratory: Positive for cough. Negative for shortness of breath and wheezing.   Cardiovascular: Negative for chest pain and palpitations.  Gastrointestinal: Positive for vomiting and change in bowel habit. Negative for abdominal pain and blood in stool.  Genitourinary: Negative for dysuria, frequency and hematuria.  Musculoskeletal: Positive for myalgias and  arthralgias. Negative for back pain.  Skin: Negative.   Neurological: Positive for headaches. Negative for dizziness, seizures and speech difficulty.  Psychiatric/Behavioral: Negative for hallucinations and confusion.    Allergies  Tramadol hcl  Home Medications   Current Outpatient Rx  Name  Route  Sig  Dispense  Refill  . PSEUDOEPHEDRINE-IBUPROFEN 30-200 MG PO TABS   Oral   Take 1-2 tablets by mouth daily as needed. For cold and flu symptoms         . RANITIDINE HCL 75 MG PO TABS   Oral   Take 75 mg by mouth 3 (three) times daily.            BP 156/94  Temp 99.3 F (37.4 C) (Oral)  Resp 20  Ht 6' (1.829 m)  Wt 200 lb (90.719 kg)  BMI 27.12 kg/m2  SpO2 98%  Physical Exam  Nursing note and vitals reviewed. Constitutional: He is oriented to person, place, and time. He appears well-developed and well-nourished.  Non-toxic appearance.  HENT:  Head: Normocephalic.  Right Ear: Tympanic membrane and external ear normal.  Left Ear: Tympanic membrane and external ear normal.       Nasal congestion present. Mod increase redness of the posterior pharynx.  Eyes: EOM and lids are normal. Pupils are equal, round, and reactive to light.  Neck: Normal range of motion. Neck supple. Carotid bruit is not present.  Cardiovascular: Normal rate, regular rhythm, normal heart sounds, intact distal pulses and normal pulses.   No murmur heard. Pulmonary/Chest: Breath sounds normal. No respiratory distress.  Course breath sounds with scattered rhonchi.  Abdominal: Soft. Bowel sounds are normal. There is no tenderness. There is no guarding.  Musculoskeletal: Normal range of motion.  Lymphadenopathy:       Head (right side): No submandibular adenopathy present.       Head (left side): No submandibular adenopathy present.    He has no cervical adenopathy.  Neurological: He is alert and oriented to person, place, and time. He has normal strength. No cranial nerve deficit or sensory  deficit.  Skin: Skin is warm and dry.  Psychiatric: He has a normal mood and affect. His speech is normal.    ED Course  Procedures (including critical care time)   Labs Reviewed  RAPID STREP SCREEN   No results found.   No diagnosis found.    MDM  I have reviewed nursing notes, vital signs, and all appropriate lab and imaging results for this patient.* Patient presents to the emergency department with 4 days of flu-type symptoms including nasal congestion body aches chills and generally not feeling well. The examination is consistent with upper respiratory infection. The vital signs show the temperature to be 99.3 and the blood pressure to be 156/94 otherwise within normal limits. The plan at this time is to use the promethazine dextromethorphan cough medication. Patient advised to use Tylenol and ibuprofen for aches and fever. Patient advised to increase fluids and wash hands frequently.       Kathie Dike, [place] 03/19/12 [phone]

## 2012-03-22 NOTE — ED Provider Notes (Signed)
Medical screening examination/treatment/procedure(s) were performed by non-physician practitioner and as supervising physician I was immediately available for consultation/collaboration.  Khaliah Barnick, MD 03/22/12 0142 

## 2012-05-18 ENCOUNTER — Encounter (HOSPITAL_COMMUNITY): Payer: Self-pay | Admitting: *Deleted

## 2012-05-18 ENCOUNTER — Emergency Department (HOSPITAL_COMMUNITY)
Admission: EM | Admit: 2012-05-18 | Discharge: 2012-05-18 | Disposition: A | Discharge status: Home / Self Care | Payer: Self-pay | Attending: Emergency Medicine | Admitting: Emergency Medicine

## 2012-05-18 DIAGNOSIS — Z8711 Personal history of peptic ulcer disease: Secondary | ICD-10-CM | POA: Insufficient documentation

## 2012-05-18 DIAGNOSIS — Z87442 Personal history of urinary calculi: Secondary | ICD-10-CM | POA: Insufficient documentation

## 2012-05-18 DIAGNOSIS — F17200 Nicotine dependence, unspecified, uncomplicated: Secondary | ICD-10-CM | POA: Insufficient documentation

## 2012-05-18 DIAGNOSIS — K047 Periapical abscess without sinus: Secondary | ICD-10-CM | POA: Insufficient documentation

## 2012-05-18 DIAGNOSIS — Z79899 Other long term (current) drug therapy: Secondary | ICD-10-CM | POA: Insufficient documentation

## 2012-05-18 DIAGNOSIS — I10 Essential (primary) hypertension: Secondary | ICD-10-CM | POA: Insufficient documentation

## 2012-05-18 MED ORDER — HYDROCODONE-ACETAMINOPHEN 5-325 MG PO TABS: 1.0000 | ORAL_TABLET | ORAL | Status: DC | PRN

## 2012-05-18 MED ORDER — AMOXICILLIN 500 MG PO CAPS: 500.0000 mg | ORAL_CAPSULE | Freq: Three times a day (TID) | ORAL | Status: AC

## 2012-05-18 NOTE — ED Provider Notes (Signed)
Medical screening examination/treatment/procedure(s) were performed by non-physician practitioner and as supervising physician I was immediately available for consultation/collaboration.   Yovany Clock L Abram Sax, MD 05/18/12 1353 

## 2012-05-18 NOTE — ED Notes (Signed)
Pt states second from back molar on right upper broke while eating cereal this morning, poor oral hygiene noted and significant decay noted to several teeth, pt states has an appt. At [facility] in April

## 2012-05-18 NOTE — ED Provider Notes (Signed)
History     CSN: [id number]  Arrival date & time 05/18/12  1206   First MD Initiated Contact with Patient 05/18/12 1221      Chief Complaint  Patient presents with  . Dental Pain    (Consider location/radiation/quality/duration/timing/severity/associated sxs/prior treatment) HPI Comments: Jeremy Nichols is a 32 y.o. Male presenting with dental pain which started this morning when he fractured an already decayed tooth while eating his breakfast cereal.  He reports the beginning of surrounding swelling around the injured tooth.  He has taken no medicines prior to arrival.  There has been no fevers,  Chills, nausea or vomiting, also no complaint of difficulty swallowing,  Although chewing makes pain worse.  He does not have a dentist but does have a [facility] to establish care with a dentist.     The history is provided by the patient.    Past Medical History  Diagnosis Date  . Kidney stones   . Kidney stones   . Kidney stones   . Peptic ulcer   . Hypertension     History reviewed. No pertinent past surgical history.  No family history on file.  History  Substance Use Topics  . Smoking status: Current Every Day Smoker -- 1.00 packs/day    Types: Cigarettes  . Smokeless tobacco: Not on file  . Alcohol Use: Yes     Comment: occasional      Review of Systems  Constitutional: Negative for fever.  HENT: Positive for dental problem. Negative for sore throat, facial swelling, neck pain and neck stiffness.   Respiratory: Negative for shortness of breath.     Allergies  Tramadol hcl  Home Medications   Current Outpatient Rx  Name  Route  Sig  Dispense  Refill  . amoxicillin (AMOXIL) 500 MG capsule   Oral   Take 1 capsule (500 mg total) by mouth 3 (three) times daily.   30 capsule   0   . HYDROcodone-acetaminophen (NORCO/VICODIN) 5-325 MG per tablet   Oral   Take 1 tablet by mouth every 4 (four) hours as needed for pain.   20  tablet   0   . ranitidine (ZANTAC) 75 MG tablet   Oral   Take 75 mg by mouth 3 (three) times daily.            BP 158/86  Pulse 82  Temp(Src) 97.6 F (36.4 C) (Oral)  Resp 16  SpO2 99%  Physical Exam  Constitutional: He is oriented to person, place, and time. He appears well-developed and well-nourished. No distress.  HENT:  Head: Normocephalic and atraumatic.  Right Ear: Tympanic membrane and external ear normal.  Left Ear: Tympanic membrane and external ear normal.  Mouth/Throat: Oropharynx is clear and moist and mucous membranes are normal. No oral lesions. Dental abscesses present.  Right upper 1st molar with fracture noted along posterior edge of tooth. Generalized poor dentition with multiple old appearing fractures and decayed teeth, gingival erythema,  Poor dental hygiene.  Eyes: Conjunctivae are normal.  Neck: Normal range of motion. Neck supple.  Cardiovascular: Normal rate and normal heart sounds.   Pulmonary/Chest: Effort normal.  Abdominal: He exhibits no distension.  Musculoskeletal: Normal range of motion.  Lymphadenopathy:    He has no cervical adenopathy.  Neurological: He is alert and oriented to person, place, and time.  Skin: Skin is warm and dry. No erythema.  Psychiatric: He has a normal mood and affect.  ED Course  Procedures (including critical care time)  Labs Reviewed - No data to display No results found.   1. Dental abscess       MDM  Pt with poor dentition,  New dental fracture in an already severely decayed tooth with surrounding erythema,  No obvious pus pocket of infection visualized on exam,  But concern for developing infection.  Amoxil, hydrocodone,  Dental referrals given or f[facility]u with [facility] as [facility].        [name], PA 05/18/12 1245

## 2012-05-18 NOTE — ED Notes (Signed)
Pt states dental pain began this morning. Tooth broke off while eating cereal.

## 2012-08-05 ENCOUNTER — Emergency Department (HOSPITAL_COMMUNITY)
Admission: EM | Admit: 2012-08-05 | Discharge: 2012-08-06 | Disposition: A | Discharge status: Home / Self Care | Payer: Self-pay | Attending: Emergency Medicine | Admitting: Emergency Medicine

## 2012-08-05 ENCOUNTER — Encounter (HOSPITAL_COMMUNITY): Payer: Self-pay | Admitting: *Deleted

## 2012-08-05 DIAGNOSIS — Z8711 Personal history of peptic ulcer disease: Secondary | ICD-10-CM | POA: Insufficient documentation

## 2012-08-05 DIAGNOSIS — R319 Hematuria, unspecified: Secondary | ICD-10-CM | POA: Insufficient documentation

## 2012-08-05 DIAGNOSIS — R110 Nausea: Secondary | ICD-10-CM | POA: Insufficient documentation

## 2012-08-05 DIAGNOSIS — F17200 Nicotine dependence, unspecified, uncomplicated: Secondary | ICD-10-CM | POA: Insufficient documentation

## 2012-08-05 DIAGNOSIS — N200 Calculus of kidney: Secondary | ICD-10-CM | POA: Insufficient documentation

## 2012-08-05 DIAGNOSIS — Z79899 Other long term (current) drug therapy: Secondary | ICD-10-CM | POA: Insufficient documentation

## 2012-08-05 DIAGNOSIS — I10 Essential (primary) hypertension: Secondary | ICD-10-CM | POA: Insufficient documentation

## 2012-08-05 LAB — URINALYSIS, ROUTINE W REFLEX MICROSCOPIC
Bilirubin Urine: NEGATIVE
Nitrite: NEGATIVE
Specific Gravity, Urine: >1.03 — ABNORMAL HIGH (ref 1.005–1.030)
pH: 6 (ref 5.0–8.0)

## 2012-08-05 LAB — URINE MICROSCOPIC-ADD ON

## 2012-08-05 MED ORDER — ONDANSETRON HCL 4 MG/2ML IJ SOLN
4.0000 mg | Freq: Once | INTRAMUSCULAR | Status: AC
  Administered 2012-08-05: 4 mg via INTRAVENOUS

## 2012-08-05 MED ORDER — HYDROMORPHONE HCL PF 1 MG/ML IJ SOLN
1.0000 mg | Freq: Once | INTRAMUSCULAR | Status: AC
  Administered 2012-08-05: 1 mg via INTRAVENOUS

## 2012-08-05 NOTE — ED Notes (Signed)
Pt reports right pelvic pain that goes to his groin. HX of kidney stones

## 2012-08-06 ENCOUNTER — Emergency Department (HOSPITAL_COMMUNITY): Payer: Self-pay

## 2012-08-06 MED ORDER — SODIUM CHLORIDE 0.9 % IV SOLN: 1000.0000 mL | INTRAVENOUS | Status: DC

## 2012-08-06 MED ORDER — HYDROMORPHONE HCL PF 1 MG/ML IJ SOLN
1.0000 mg | Freq: Once | INTRAMUSCULAR | Status: AC
  Administered 2012-08-06: 1 mg via INTRAVENOUS
  Filled 2012-08-06: qty 1

## 2012-08-06 MED ORDER — HYDROMORPHONE HCL PF 1 MG/ML IJ SOLN
INTRAMUSCULAR | Status: AC
  Filled 2012-08-06: qty 1

## 2012-08-06 MED ORDER — HYDROCODONE-ACETAMINOPHEN 5-325 MG PO TABS: 1.0000 | ORAL_TABLET | ORAL | Status: DC | PRN

## 2012-08-06 MED ORDER — ONDANSETRON 4 MG PO TBDP: 4.0000 mg | ORAL_TABLET | Freq: Three times a day (TID) | ORAL | Status: DC | PRN

## 2012-08-06 MED ORDER — SODIUM CHLORIDE 0.9 % IV SOLN
1000.0000 mL | Freq: Once | INTRAVENOUS | Status: AC
  Administered 2012-08-06: 1000 mL via INTRAVENOUS

## 2012-08-06 MED ORDER — ONDANSETRON HCL 4 MG/2ML IJ SOLN
INTRAMUSCULAR | Status: AC
  Filled 2012-08-06: qty 2

## 2012-08-06 NOTE — ED Provider Notes (Signed)
History     CSN: [id number]  Arrival date & time 08/05/12  2319   First MD Initiated Contact with Patient 08/05/12 2337      Chief Complaint  Patient presents with  . Flank Pain    (Consider location/radiation/quality/duration/timing/severity/associated sxs/prior treatment) HPI HPI Comments: Jeremy Nichols is a 32 y.o. male who presents to the Emergency Department complaining of right lower abdominal pain with radiation to the scrotum. He has a h/o kidney stones, last one was 2 years ago. Pain is associated with nausea no vomiting. He has taken no medications. Past Medical History  Diagnosis Date  . Kidney stones   . Kidney stones   . Kidney stones   . Peptic ulcer   . Hypertension     History reviewed. No pertinent past surgical history.  No family history on file.  History  Substance Use Topics  . Smoking status: Current Every Day Smoker -- 1.00 packs/day    Types: Cigarettes  . Smokeless tobacco: Not on file  . Alcohol Use: Yes     Comment: occasional      Review of Systems  Constitutional: Negative for fever.       10 Systems reviewed and are negative for acute change except as noted in the HPI.  HENT: Negative for congestion.   Eyes: Negative for discharge and redness.  Respiratory: Negative for cough and shortness of breath.   Cardiovascular: Negative for chest pain.  Gastrointestinal: Positive for abdominal pain. Negative for vomiting.  Genitourinary: Positive for hematuria.  Musculoskeletal: Negative for back pain.  Skin: Negative for rash.  Neurological: Negative for syncope, numbness and headaches.  Psychiatric/Behavioral:       No behavior change.    Allergies  Tramadol hcl  Home Medications   Current Outpatient Rx  Name  Route  Sig  Dispense  Refill  . ranitidine (ZANTAC) 75 MG tablet   Oral   Take 75 mg by mouth 2 (two) times daily.         Marland Kitchen HYDROcodone-acetaminophen (NORCO/VICODIN) 5-325 MG per tablet   Oral   Take 1 tablet by  mouth every 4 (four) hours as needed for pain.   20 tablet   0     BP 157/97  Pulse 102  Temp(Src) 98.3 F (36.8 C) (Oral)  Resp 20  Ht 6' (1.829 m)  Wt 200 lb (90.719 kg)  BMI 27.12 kg/m2  SpO2 98%  Physical Exam  Nursing note and vitals reviewed. Constitutional: He appears well-developed and well-nourished.  Awake, alert, nontoxic appearance.  HENT:  Head: Normocephalic and atraumatic.  Eyes: EOM are normal. Pupils are equal, round, and reactive to light.  Neck: Normal range of motion. Neck supple.  Cardiovascular: Normal rate and intact distal pulses.   Pulmonary/Chest: Effort normal and breath sounds normal. He exhibits no tenderness.  Abdominal: Soft. Bowel sounds are normal. There is no tenderness. There is no rebound.  Mild right lower tenderness with palpation  Genitourinary:  No cva tenderenss  Musculoskeletal: He exhibits no tenderness.  Baseline ROM, no obvious new focal weakness.  Neurological:  Mental status and motor strength appears baseline for patient and situation.  Skin: No rash noted.  Psychiatric: He has a normal mood and affect.    ED Course  Procedures (including critical care time) Results for orders placed during the hospital encounter of 08/05/12  URINALYSIS, ROUTINE W REFLEX MICROSCOPIC      Result Value Range   Color, Urine YELLOW  YELLOW   APPearance  CLOUDY (*) CLEAR   Specific Gravity, Urine >1.030 (*) 1.005 - 1.030   pH 6.0  5.0 - 8.0   Glucose, UA NEGATIVE  NEGATIVE mg/dL   Hgb urine dipstick LARGE (*) NEGATIVE   Bilirubin Urine NEGATIVE  NEGATIVE   Ketones, ur NEGATIVE  NEGATIVE mg/dL   Protein, ur NEGATIVE  NEGATIVE mg/dL   Urobilinogen, UA 0.2  0.0 - 1.0 mg/dL   Nitrite NEGATIVE  NEGATIVE   Leukocytes, UA NEGATIVE  NEGATIVE  URINE MICROSCOPIC-ADD ON      Result Value Range   Squamous Epithelial / LPF RARE  RARE   WBC, UA 0-2  <3 WBC/hpf   RBC / HPF TOO NUMEROUS TO COUNT  <3 RBC/hpf   Bacteria, UA FEW (*) RARE    Ct  Abdomen Pelvis Wo Contrast  08/06/2012   *RADIOLOGY REPORT*  Clinical Data: Right flank pain.  History of renal calculi.  CT ABDOMEN AND PELVIS WITHOUT CONTRAST  Technique:  Multidetector CT imaging of the abdomen and pelvis was performed following the standard protocol without intravenous contrast.  Comparison: 02/08/2010.  Findings: The lung bases are clear.  No pleural effusion.  The unenhanced appearance of the liver is unremarkable except for a stable small hepatic cysts.  No biliary dilatation.  The gallbladder is normal.  No common bile duct dilatation.  The the pancreas is normal.  The spleen is normal in size.  No focal lesions.  The adrenal glands are unremarkable and stable.  There are numerous bilateral renal calculi.  Moderate right-sided hydronephrosis and hydroureter down to an obstructing 4 mm right UVJ calculus.  No left-sided ureteral calculi or hydronephrosis.  The stomach, duodenum, small bowel and colon are unremarkable.  No inflammatory changes or mass lesions.  The appendix is normal.  No mesenteric or retroperitoneal mass or adenopathy.  The aorta is normal in caliber.  Minimal scattered atherosclerotic calcifications.  The bladder, prostate gland and seminal vesicles are unremarkable. No pelvic mass, adenopathy or free pelvic fluid collections.  No inguinal mass or hernia.  The bony structures are unremarkable.  Stable benign appearing cystic lesion in the right femoral head.  IMPRESSION:  1.  4 mm right UVJ calculus causing moderate obstruction of the right ureter and kidney. 2.  Bilateral renal calculi.   Original Report Authenticated By: Rudie Meyer, M.D.   Medications  0.9 %  sodium chloride infusion (1,000 mLs Intravenous New Bag/Given 08/06/12 0004)    Followed by  0.9 %  sodium chloride infusion (not administered)  HYDROmorphone (DILAUDID) injection 1 mg (not administered)  HYDROmorphone (DILAUDID) injection 1 mg (1 mg Intravenous Given 08/05/12 2359)  ondansetron (ZOFRAN)  injection 4 mg (4 mg Intravenous Given 08/05/12 2356)    No diagnosis found.    MDM  Patient presents with right sided lower abdominal pain that radiates into the scrotum. UA with hgb. CT with a 4 mm stone at the right UVJ. Given dilaudid and zofran with some relief. Given additional dilaudid. Reviewed results with patient. Pt stable in ED with no significant deterioration in condition.The patient appears reasonably screened and/or stabilized for discharge and I doubt any other medical condition or other [facility] requiring further screening, evaluation, or treatment in the ED at this time prior to discharge.  MDM Reviewed: nursing note and vitals Interpretation: labs and CT scan           Nicoletta Dress. Colon Branch, MD 08/06/12 0111

## 2012-08-06 NOTE — ED Notes (Signed)
Pt alert & oriented x4, stable gait. Patient given discharge instructions, paperwork & prescription(s). Patient  instructed to stop at the registration desk to finish any additional paperwork. Patient verbalized understanding. Pt left department w/ no further questions. 

## 2012-09-29 ENCOUNTER — Emergency Department (HOSPITAL_COMMUNITY): Payer: Self-pay

## 2012-09-29 ENCOUNTER — Emergency Department (HOSPITAL_COMMUNITY)
Admission: EM | Admit: 2012-09-29 | Discharge: 2012-09-29 | Disposition: A | Discharge status: Home / Self Care | Payer: Self-pay | Attending: Emergency Medicine | Admitting: Emergency Medicine

## 2012-09-29 ENCOUNTER — Encounter (HOSPITAL_COMMUNITY): Payer: Self-pay | Admitting: *Deleted

## 2012-09-29 DIAGNOSIS — Z8711 Personal history of peptic ulcer disease: Secondary | ICD-10-CM | POA: Insufficient documentation

## 2012-09-29 DIAGNOSIS — I10 Essential (primary) hypertension: Secondary | ICD-10-CM | POA: Insufficient documentation

## 2012-09-29 DIAGNOSIS — L089 Local infection of the skin and subcutaneous tissue, unspecified: Secondary | ICD-10-CM | POA: Insufficient documentation

## 2012-09-29 DIAGNOSIS — Z87442 Personal history of urinary calculi: Secondary | ICD-10-CM | POA: Insufficient documentation

## 2012-09-29 DIAGNOSIS — F17200 Nicotine dependence, unspecified, uncomplicated: Secondary | ICD-10-CM | POA: Insufficient documentation

## 2012-09-29 DIAGNOSIS — Z79899 Other long term (current) drug therapy: Secondary | ICD-10-CM | POA: Insufficient documentation

## 2012-09-29 MED ORDER — SULFAMETHOXAZOLE-TRIMETHOPRIM 800-160 MG PO TABS: 1.0000 | ORAL_TABLET | Freq: Two times a day (BID) | ORAL | Status: AC

## 2012-09-29 MED ORDER — SULFAMETHOXAZOLE-TRIMETHOPRIM 800-160 MG PO TABS: 1.0000 | ORAL_TABLET | Freq: Two times a day (BID) | ORAL | Status: DC

## 2012-09-29 MED ORDER — HYDROCODONE-ACETAMINOPHEN 5-325 MG PO TABS
1.0000 | ORAL_TABLET | Freq: Once | ORAL | Status: AC
  Administered 2012-09-29: 1 via ORAL
  Filled 2012-09-29: qty 1

## 2012-09-29 NOTE — ED Notes (Signed)
Pt alert & oriented x4, stable gait. Patient given discharge instructions, paperwork & prescription(s). Patient  instructed to stop at the registration desk to finish any additional paperwork. Patient verbalized understanding. Pt left department w/ no further questions. 

## 2012-09-29 NOTE — ED Notes (Signed)
Pt has a rash on the side of left foot.

## 2012-10-01 NOTE — ED Provider Notes (Signed)
History    CSN: [id number] Arrival date & time 09/29/12  2041  First MD Initiated Contact with Patient 09/29/12 2209     Chief Complaint  Patient presents with  . Rash   (Consider location/radiation/quality/duration/timing/severity/associated sxs/prior Treatment) HPI Comments: Jeremy Nichols is a 32 y.o. Male presenting with a persistent tender rash on the medial side of his left foot since he scraped on a rock while walking in a river one month ago.  He describes constant redness at the site, tender to palpation and intermittent blister formation which have produced a small amount of pus when they spontaneously drain.  He denies any foreign body sensation, he has had no significant swelling at the site.  It is painful for him to wear his work boots.  He has been applying triple antibiotic ointment without improvement.       The history is provided by the patient.   Past Medical History  Diagnosis Date  . Kidney stones   . Kidney stones   . Kidney stones   . Peptic ulcer   . Hypertension    History reviewed. No pertinent past surgical history. History reviewed. No pertinent family history. History  Substance Use Topics  . Smoking status: Current Every Day Smoker -- 1.00 packs/day    Types: Cigarettes  . Smokeless tobacco: Not on file  . Alcohol Use: Yes     Comment: occasional    Review of Systems  Constitutional: Negative for fever and chills.  HENT: Negative for facial swelling.   Respiratory: Negative for shortness of breath and wheezing.   Skin: Positive for color change and rash.  Neurological: Negative for numbness.    Allergies  Tramadol hcl  Home Medications   Current Outpatient Rx  Name  Route  Sig  Dispense  Refill  . ibuprofen (ADVIL,MOTRIN) 200 MG tablet   Oral   Take 800 mg by mouth every 6 (six) hours as needed for pain.         Marland Kitchen Neomycin-Bacitracin Zn-Polymyx (TRIPLE ANTIBIOTIC OP)   Ophthalmic   Apply 1 application to eye daily as needed  (wound).         . ranitidine (ZANTAC) 75 MG tablet   Oral   Take 75 mg by mouth 2 (two) times daily.         Marland Kitchen sulfamethoxazole-trimethoprim (BACTRIM DS,SEPTRA DS) 800-160 MG per tablet   Oral   Take 1 tablet by mouth 2 (two) times daily.   20 tablet   0    BP 146/87  Pulse 78  Temp(Src) 98.2 F (36.8 C) (Oral)  Resp 24  Ht 6' (1.829 m)  Wt 200 lb (90.719 kg)  BMI 27.12 kg/m2  SpO2 99% Physical Exam  Constitutional: He appears well-developed and well-nourished. No distress.  HENT:  Head: Normocephalic.  Neck: Neck supple.  Cardiovascular: Normal rate.   Pulmonary/Chest: Effort normal. He has no wheezes.  Musculoskeletal: Normal range of motion. He exhibits no edema.  Skin: There is erythema.  Localized erythema left medial foot at mtp.  No edema or red streaking.  There is peeling of the epidermis,  2 small raised papules,  No induration, fluctuance. Less than 3 sec cap refill.    ED Course  Procedures (including critical care time) Labs Reviewed  GLUCOSE, CAPILLARY   Dg Foot Complete Left  09/29/2012   *RADIOLOGY REPORT*  Clinical Data: Rash at abrasion site along lateral foot after injury 1 month ago.  LEFT FOOT - COMPLETE 3+  VIEW  Comparison: None.  Findings: Bones, joint spaces and soft tissues are within normal.  IMPRESSION: No acute findings.   Original Report Authenticated By: Elberta Fortis, M.D.   1. Left foot infection     MDM  Patients labs and/or radiological studies were viewed and considered during the medical decision making and disposition process. Pt was placed on bactrim,  Encouraged warm epsom soaks,  Stop using topical abx - this could be allergic reaction to this product.  Recheck if not improving or if sx worsen.  The patient appears reasonably screened and/or stabilized for discharge and I doubt any other medical condition or other [facility] requiring further screening, evaluation, or treatment in the ED at this time prior to discharge.   [name], PA-C 10/01/12 1411

## 2012-10-02 NOTE — ED Provider Notes (Signed)
Medical screening examination/treatment/procedure(s) were performed by non-physician practitioner and as supervising physician I was immediately available for consultation/collaboration.  Abel Ra, MD 10/02/12 0731 

## 2012-10-09 ENCOUNTER — Emergency Department (HOSPITAL_COMMUNITY)
Admission: EM | Admit: 2012-10-09 | Discharge: 2012-10-09 | Disposition: A | Discharge status: Home / Self Care | Payer: Self-pay | Attending: Emergency Medicine | Admitting: Emergency Medicine

## 2012-10-09 ENCOUNTER — Encounter (HOSPITAL_COMMUNITY): Payer: Self-pay | Admitting: Emergency Medicine

## 2012-10-09 DIAGNOSIS — I10 Essential (primary) hypertension: Secondary | ICD-10-CM | POA: Insufficient documentation

## 2012-10-09 DIAGNOSIS — Z87442 Personal history of urinary calculi: Secondary | ICD-10-CM | POA: Insufficient documentation

## 2012-10-09 DIAGNOSIS — K0510 Chronic gingivitis, plaque induced: Secondary | ICD-10-CM | POA: Insufficient documentation

## 2012-10-09 DIAGNOSIS — K047 Periapical abscess without sinus: Secondary | ICD-10-CM | POA: Insufficient documentation

## 2012-10-09 DIAGNOSIS — K029 Dental caries, unspecified: Secondary | ICD-10-CM | POA: Insufficient documentation

## 2012-10-09 DIAGNOSIS — K0889 Other specified disorders of teeth and supporting structures: Secondary | ICD-10-CM

## 2012-10-09 DIAGNOSIS — F17200 Nicotine dependence, unspecified, uncomplicated: Secondary | ICD-10-CM | POA: Insufficient documentation

## 2012-10-09 DIAGNOSIS — K089 Disorder of teeth and supporting structures, unspecified: Secondary | ICD-10-CM | POA: Insufficient documentation

## 2012-10-09 DIAGNOSIS — Z79899 Other long term (current) drug therapy: Secondary | ICD-10-CM | POA: Insufficient documentation

## 2012-10-09 DIAGNOSIS — Z8719 Personal history of other diseases of the digestive system: Secondary | ICD-10-CM | POA: Insufficient documentation

## 2012-10-09 MED ORDER — AMOXICILLIN 500 MG PO CAPS: 500.0000 mg | ORAL_CAPSULE | Freq: Three times a day (TID) | ORAL | Status: DC

## 2012-10-09 MED ORDER — HYDROCODONE-ACETAMINOPHEN 5-325 MG PO TABS
1.0000 | ORAL_TABLET | Freq: Once | ORAL | Status: AC
  Administered 2012-10-09: 1 via ORAL
  Filled 2012-10-09: qty 1

## 2012-10-09 MED ORDER — HYDROCODONE-ACETAMINOPHEN 5-325 MG PO TABS: 1.0000 | ORAL_TABLET | ORAL | Status: DC | PRN

## 2012-10-09 MED ORDER — AMOXICILLIN 250 MG PO CAPS
500.0000 mg | ORAL_CAPSULE | Freq: Once | ORAL | Status: AC
  Administered 2012-10-09: 500 mg via ORAL
  Filled 2012-10-09: qty 2

## 2012-10-09 NOTE — ED Notes (Signed)
Pt c/o right side dental pain x 1 day. No obvious swelling noted. Nad.

## 2012-10-09 NOTE — Discharge Instructions (Signed)
Dental Pain A tooth ache may be caused by cavities (tooth decay). Cavities expose the nerve of the tooth to air and hot or cold temperatures. It may come from an infection or abscess (also called a boil or furuncle) around your tooth. It is also often caused by dental caries (tooth decay). This causes the pain you are having. DIAGNOSIS  Your caregiver can diagnose this problem by exam. TREATMENT   If caused by an infection, it may be treated with medications which kill germs (antibiotics) and pain medications as prescribed by your caregiver. Take medications as directed.  Only take over-the-counter or prescription medicines for pain, discomfort, or fever as directed by your caregiver.  Whether the tooth ache today is caused by infection or dental disease, you should see your dentist as soon as possible for further care. SEEK MEDICAL CARE IF: The exam and treatment you received today has been provided on an emergency basis only. This is not a substitute for complete medical or dental care. If your problem worsens or new problems (symptoms) appear, and you are unable to meet with your dentist, call or return to this location. SEEK IMMEDIATE MEDICAL CARE IF:   You have a fever.  You develop redness and swelling of your face, jaw, or neck.  You are unable to open your mouth.  You have severe pain uncontrolled by pain medicine. MAKE SURE YOU:   Understand these instructions.  Will watch your condition.  Will get help right away if you are not doing well or get worse. Document Released: 03/06/2005 Document Revised: 05/29/2011 Document Reviewed: 10/23/2007 [facility] Patient Information 2014 [place], [place].   Complete your entire course of antibiotics as prescribed.  You  may use the hydrocodone for pain relief but do not drive within 4 hours of taking as this will make you drowsy.  Avoid applying heat or ice to this abscess area which can worsen your symptoms.  You may use warm salt water  swish and spit treatment or half peroxide and water swish and spit after meals to keep this area clean as discussed.  Call your dentist listed above for further management of your symptoms.

## 2012-10-09 NOTE — ED Provider Notes (Signed)
History    CSN: [id number] Arrival date & time 10/09/12  0865  First MD Initiated Contact with Patient 10/09/12 [phone]     Chief Complaint  Patient presents with  . Dental Pain   (Consider location/radiation/quality/duration/timing/severity/associated sxs/prior Treatment) HPI Comments: Jeremy Nichols is a 32 y.o. Male presenting with dental pain and gingival swelling which woke him around 1 am today.   The patient has a history of decay in the tooth involved which has recently started to cause increased pain.  There has been no fevers,  Chills, nausea or vomiting, also no complaint of difficulty swallowing,  Although chewing makes pain worse.  The patient has tried ibuprofen without relief of symptoms.        The history is provided by the patient.   Past Medical History  Diagnosis Date  . Kidney stones   . Kidney stones   . Kidney stones   . Peptic ulcer   . Hypertension    History reviewed. No pertinent past surgical history. History reviewed. No pertinent family history. History  Substance Use Topics  . Smoking status: Current Every Day Smoker -- 1.00 packs/day    Types: Cigarettes  . Smokeless tobacco: Not on file  . Alcohol Use: Yes     Comment: occasional    Review of Systems  Constitutional: Negative for fever.  HENT: Positive for dental problem. Negative for sore throat, facial swelling, neck pain and neck stiffness.   Respiratory: Negative for shortness of breath.     Allergies  Tramadol hcl  Home Medications   Current Outpatient Rx  Name  Route  Sig  Dispense  Refill  . ibuprofen (ADVIL,MOTRIN) 200 MG tablet   Oral   Take 800 mg by mouth every 6 (six) hours as needed for pain.         . ranitidine (ZANTAC) 75 MG tablet   Oral   Take 75 mg by mouth 2 (two) times daily.          BP 148/106  Pulse 90  Temp(Src) 97.9 F (36.6 C) (Oral)  Resp 17  SpO2 98% Physical Exam  Constitutional: He is oriented to person, place, and time. He appears  well-developed and well-nourished. No distress.  HENT:  Head: Normocephalic and atraumatic.  Right Ear: Tympanic membrane and external ear normal.  Left Ear: Tympanic membrane and external ear normal.  Mouth/Throat: Oropharynx is clear and moist and mucous membranes are normal. No oral lesions. Abnormal dentition. Dental abscesses and dental caries present.  Poor dental hygiene,  Moderate gingival erythema around right upper molars,  Decay with large amount of tartar build up.  Several cavities noted.  No facial swelling,  No fluctuance or induration.  Eyes: Conjunctivae are normal.  Neck: Normal range of motion. Neck supple.  Cardiovascular: Normal rate and normal heart sounds.   Pulmonary/Chest: Effort normal.  Abdominal: He exhibits no distension.  Musculoskeletal: Normal range of motion.  Lymphadenopathy:    He has no cervical adenopathy.  Neurological: He is alert and oriented to person, place, and time.  Skin: Skin is warm and dry. No erythema.  Psychiatric: He has a normal mood and affect.    ED Course  Procedures (including critical care time) Labs Reviewed - No data to display No results found. 1. Pain, dental   2. Gingivitis     MDM  Hydrocodone, amoxil,  Encouraged dental f/u.  He was seen by Dr. Blondell Reveal several months ago for dental extraction, referred through the free  clinic.  Encouraged to contact for further management.  The patient appears reasonably screened and/or stabilized for discharge and I doubt any other medical condition or other [facility] requiring further screening, evaluation, or treatment in the ED at this time prior to discharge.   [name], PA-C 10/09/12 1006

## 2012-10-09 NOTE — ED Provider Notes (Signed)
Medical screening examination/treatment/procedure(s) were performed by non-physician practitioner and as supervising physician I was immediately available for consultation/collaboration.   Shelda Jakes, MD 10/09/12 [phone]

## 2012-11-17 ENCOUNTER — Encounter (HOSPITAL_COMMUNITY): Payer: Self-pay | Admitting: Emergency Medicine

## 2012-11-17 ENCOUNTER — Emergency Department (HOSPITAL_COMMUNITY)
Admission: EM | Admit: 2012-11-17 | Discharge: 2012-11-18 | Disposition: A | Discharge status: Home / Self Care | Payer: Self-pay | Attending: Emergency Medicine | Admitting: Emergency Medicine

## 2012-11-17 DIAGNOSIS — Z79899 Other long term (current) drug therapy: Secondary | ICD-10-CM | POA: Insufficient documentation

## 2012-11-17 DIAGNOSIS — Z87442 Personal history of urinary calculi: Secondary | ICD-10-CM | POA: Insufficient documentation

## 2012-11-17 DIAGNOSIS — I10 Essential (primary) hypertension: Secondary | ICD-10-CM | POA: Insufficient documentation

## 2012-11-17 DIAGNOSIS — F17200 Nicotine dependence, unspecified, uncomplicated: Secondary | ICD-10-CM | POA: Insufficient documentation

## 2012-11-17 DIAGNOSIS — Z792 Long term (current) use of antibiotics: Secondary | ICD-10-CM | POA: Insufficient documentation

## 2012-11-17 DIAGNOSIS — H538 Other visual disturbances: Secondary | ICD-10-CM | POA: Insufficient documentation

## 2012-11-17 DIAGNOSIS — Z8711 Personal history of peptic ulcer disease: Secondary | ICD-10-CM | POA: Insufficient documentation

## 2012-11-17 DIAGNOSIS — H01006 Unspecified blepharitis left eye, unspecified eyelid: Secondary | ICD-10-CM

## 2012-11-17 DIAGNOSIS — H01009 Unspecified blepharitis unspecified eye, unspecified eyelid: Secondary | ICD-10-CM | POA: Insufficient documentation

## 2012-11-17 MED ORDER — TETRACAINE HCL 0.5 % OP SOLN
2.0000 [drp] | Freq: Once | OPHTHALMIC | Status: AC
  Administered 2012-11-18: 2 [drp] via OPHTHALMIC
  Filled 2012-11-17: qty 2

## 2012-11-17 NOTE — ED Provider Notes (Signed)
CSN: [id number]     Arrival date & time 11/17/12  2228 History   First MD Initiated Contact with Patient 11/17/12 2321     Chief Complaint  Patient presents with  . Eye Problem   (Consider location/radiation/quality/duration/timing/severity/associated sxs/prior Treatment) Patient is a 32 y.o. male presenting with eye problem. The history is provided by the patient.  Eye Problem Location:  L eye Severity:  Moderate Duration:  2 days Timing:  Constant Progression:  Worsening Chronicity:  New Context comment:  Unsure Relieved by:  Nothing Worsened by:  Eye movement, bright light and contact Ineffective treatments:  NSAIDs Associated symptoms: blurred vision, itching, redness and swelling   Associated symptoms: no discharge, no double vision, no facial rash, no headaches, no nausea and no vomiting   Risk factors: no conjunctival hemorrhage, not exposed to pinkeye and no previous injury to eye     WILTON THRALL is a 32 y.o.  Male who presents to the ED with left eye redness and irritation that started 2 days ago. He woke and noted the swelling of the lower lid and swelling. The symptoms have gotten worse.   Past Medical History  Diagnosis Date  . Kidney stones   . Kidney stones   . Kidney stones   . Peptic ulcer   . Hypertension    History reviewed. No pertinent past surgical history. No family history on file. History  Substance Use Topics  . Smoking status: Current Every Day Smoker -- 1.00 packs/day    Types: Cigarettes  . Smokeless tobacco: Not on file  . Alcohol Use: Yes     Comment: occasional    Review of Systems  HENT: Negative for ear pain, sore throat and neck pain.   Eyes: Positive for blurred vision, redness, itching and visual disturbance. Negative for double vision and discharge.  Respiratory: Negative for cough and shortness of breath.   Cardiovascular: Negative for chest pain.  Gastrointestinal: Negative for nausea and vomiting.  Musculoskeletal: Negative  for back pain.  Skin: Negative for rash.  Neurological: Negative for dizziness and headaches.  Psychiatric/Behavioral: The patient is not nervous/anxious.     Allergies  Tramadol hcl  Home Medications   Current Outpatient Rx  Name  Route  Sig  Dispense  Refill  . ibuprofen (ADVIL,MOTRIN) 200 MG tablet   Oral   Take 800 mg by mouth every 6 (six) hours as needed for pain.         . ranitidine (ZANTAC) 75 MG tablet   Oral   Take 75 mg by mouth 2 (two) times daily.         Marland Kitchen amoxicillin (AMOXIL) 500 MG capsule   Oral   Take 1 capsule (500 mg total) by mouth 3 (three) times daily.   30 capsule   0   . HYDROcodone-acetaminophen (NORCO/VICODIN) 5-325 MG per tablet   Oral   Take 1 tablet by mouth every 4 (four) hours as needed for pain.   10 tablet   0    BP 157/98  Pulse 99  Temp(Src) 98.3 F (36.8 C) (Oral)  Resp 18  Ht 6' (1.829 m)  Wt 200 lb (90.719 kg)  BMI 27.12 kg/m2  SpO2 100% Physical Exam  Nursing note and vitals reviewed. Constitutional: He is oriented to person, place, and time. He appears well-developed and well-nourished. No distress.  HENT:  Head: Normocephalic.  Eyes: Lids are normal. Left conjunctiva is injected. Left eye exhibits normal extraocular motion and no nystagmus.  Fundoscopic  exam:      The left eye shows red reflex.  Slit lamp exam:      The left eye shows no corneal abrasion, no foreign body and no fluorescein uptake.    There is swelling and erythema noted to the lower left lid, no definite sty noted. Tender on exam.    Neck: Neck supple.  Pulmonary/Chest: Effort normal.  Abdominal: Soft. There is no tenderness.  Musculoskeletal: Normal range of motion.  Neurological: He is alert and oriented to person, place, and time. No cranial nerve deficit.  Skin: Skin is warm and dry.  Psychiatric: He has a normal mood and affect. His behavior is normal.    ED Course: Dr. Preston Fleeting in to re examine the patient using the slit lamp.    Procedures  After tetracaine applied to left eye visual acuity repeated and vision is 20/15. MDM  32 y.o. male with blepharitis will treat with antibiotic eye ointment and PO antibiotics. Eye ointment started here in ED. Patient stable for discharge home to follow up with Opthalmology.     Medication List    TAKE these medications       cephALEXin 500 MG capsule  Commonly known as:  KEFLEX  Take 1 capsule (500 mg total) by mouth 4 (four) times daily.      ASK your doctor about these medications       amoxicillin 500 MG capsule  Commonly known as:  AMOXIL  Take 1 capsule (500 mg total) by mouth 3 (three) times daily.     HYDROcodone-acetaminophen 5-325 MG per tablet  Commonly known as:  NORCO/VICODIN  Take 1 tablet by mouth every 4 (four) hours as needed for pain.  Ask about: Which instructions should I use?     HYDROcodone-acetaminophen 5-325 MG per tablet  Commonly known as:  NORCO/VICODIN  Take 1 tablet by mouth every 4 (four) hours as needed.  Ask about: Which instructions should I use?     ibuprofen 200 MG tablet  Commonly known as:  ADVIL,MOTRIN  Take 800 mg by mouth every 6 (six) hours as needed for pain.     ranitidine 75 MG tablet  Commonly known as:  ZANTAC  Take 75 mg by mouth 2 (two) times daily.           Janne Napoleon, NP 11/21/12 2006

## 2012-11-17 NOTE — ED Notes (Signed)
Patient c/o left eye swelling on the bottom for the past couple days; patient states now having blurry vision.  Denies any drainage from left eye.

## 2012-11-18 MED ORDER — FLUORESCEIN SODIUM 1 MG OP STRP
ORAL_STRIP | OPHTHALMIC | Status: AC
  Filled 2012-11-18: qty 1

## 2012-11-18 MED ORDER — CEPHALEXIN 500 MG PO CAPS
500.0000 mg | ORAL_CAPSULE | Freq: Once | ORAL | Status: AC
  Administered 2012-11-18: 500 mg via ORAL
  Filled 2012-11-18: qty 1

## 2012-11-18 MED ORDER — CEPHALEXIN 500 MG PO CAPS: 500.0000 mg | ORAL_CAPSULE | Freq: Four times a day (QID) | ORAL | Status: DC

## 2012-11-18 MED ORDER — HYDROCODONE-ACETAMINOPHEN 5-325 MG PO TABS
1.0000 | ORAL_TABLET | Freq: Once | ORAL | Status: AC
  Administered 2012-11-18: 1 via ORAL
  Filled 2012-11-18: qty 1

## 2012-11-18 MED ORDER — CIPROFLOXACIN HCL 0.3 % OP SOLN
OPHTHALMIC | Status: AC
  Filled 2012-11-18: qty 2.5

## 2012-11-18 MED ORDER — CIPROFLOXACIN HCL 0.3 % OP SOLN
2.0000 [drp] | OPHTHALMIC | Status: DC
  Administered 2012-11-18: 2 [drp] via OPHTHALMIC
  Filled 2012-11-18: qty 2.5

## 2012-11-18 MED ORDER — HYDROCODONE-ACETAMINOPHEN 5-325 MG PO TABS: 1.0000 | ORAL_TABLET | ORAL | Status: DC | PRN

## 2012-11-18 NOTE — ED Provider Notes (Signed)
32 year old male had onset this morning of pain and redness in his left eye. On exam, there is mild erythema the left conjunctiva and mild to moderate swelling of the left lower leg which is tender. Anterior chamber is clear. He seems to have a blepharitis we'll be treated with topical and oral antibiotics and will need followup in the next one to 2 days.  Medical screening examination/treatment/procedure(s) were conducted as a shared visit with non-physician practitioner(s) and myself.  I personally evaluated the patient during the encounter   Dione Booze, MD 11/18/12 [phone]

## 2012-11-22 NOTE — ED Provider Notes (Signed)
Medical screening examination/treatment/procedure(s) were conducted as a shared visit with non-physician practitioner(s) and myself.  I personally evaluated the patient during the encounter   Dione Booze, MD 11/22/12 2326

## 2012-11-26 ENCOUNTER — Emergency Department (HOSPITAL_COMMUNITY): Payer: Self-pay

## 2012-11-26 ENCOUNTER — Emergency Department (HOSPITAL_COMMUNITY)
Admission: EM | Admit: 2012-11-26 | Discharge: 2012-11-26 | Disposition: A | Discharge status: Home / Self Care | Payer: Self-pay | Attending: Emergency Medicine | Admitting: Emergency Medicine

## 2012-11-26 ENCOUNTER — Encounter (HOSPITAL_COMMUNITY): Payer: Self-pay | Admitting: *Deleted

## 2012-11-26 DIAGNOSIS — I10 Essential (primary) hypertension: Secondary | ICD-10-CM | POA: Insufficient documentation

## 2012-11-26 DIAGNOSIS — Z79899 Other long term (current) drug therapy: Secondary | ICD-10-CM | POA: Insufficient documentation

## 2012-11-26 DIAGNOSIS — F17200 Nicotine dependence, unspecified, uncomplicated: Secondary | ICD-10-CM | POA: Insufficient documentation

## 2012-11-26 DIAGNOSIS — N200 Calculus of kidney: Secondary | ICD-10-CM | POA: Insufficient documentation

## 2012-11-26 DIAGNOSIS — Z8719 Personal history of other diseases of the digestive system: Secondary | ICD-10-CM | POA: Insufficient documentation

## 2012-11-26 LAB — URINE MICROSCOPIC-ADD ON

## 2012-11-26 LAB — URINALYSIS, ROUTINE W REFLEX MICROSCOPIC
Glucose, UA: NEGATIVE mg/dL
Ketones, ur: NEGATIVE mg/dL
Protein, ur: 30 mg/dL — AB
Urobilinogen, UA: 0.2 mg/dL (ref 0.0–1.0)

## 2012-11-26 MED ORDER — HYDROMORPHONE HCL PF 1 MG/ML IJ SOLN
1.0000 mg | Freq: Once | INTRAMUSCULAR | Status: AC
  Administered 2012-11-26: 1 mg via INTRAVENOUS
  Filled 2012-11-26: qty 1

## 2012-11-26 MED ORDER — KETOROLAC TROMETHAMINE 30 MG/ML IJ SOLN
30.0000 mg | Freq: Once | INTRAMUSCULAR | Status: AC
  Administered 2012-11-26: 30 mg via INTRAVENOUS
  Filled 2012-11-26: qty 1

## 2012-11-26 MED ORDER — ONDANSETRON HCL 4 MG/2ML IJ SOLN
4.0000 mg | Freq: Once | INTRAMUSCULAR | Status: AC
  Administered 2012-11-26: 4 mg via INTRAVENOUS
  Filled 2012-11-26: qty 2

## 2012-11-26 MED ORDER — SODIUM CHLORIDE 0.9 % IV BOLUS (SEPSIS)
500.0000 mL | Freq: Once | INTRAVENOUS | Status: AC
  Administered 2012-11-26: 500 mL via INTRAVENOUS

## 2012-11-26 MED ORDER — PROMETHAZINE HCL 25 MG PO TABS: 25.0000 mg | ORAL_TABLET | Freq: Four times a day (QID) | ORAL | Status: DC | PRN

## 2012-11-26 MED ORDER — OXYCODONE-ACETAMINOPHEN 5-325 MG PO TABS: 2.0000 | ORAL_TABLET | ORAL | Status: DC | PRN

## 2012-11-26 NOTE — ED Notes (Signed)
Pt reporting pain in right groin area.  Reports pain started yesterday but has been getting increasingly worse.  Reporting some vomiting as well.

## 2012-11-26 NOTE — ED Provider Notes (Signed)
CSN: [id number]     Arrival date & time 11/26/12  0100 History   First MD Initiated Contact with Patient 11/26/12 0102     No chief complaint on file.  chief complaint left flank pain (Consider location/radiation/quality/duration/timing/severity/associated sxs/prior Treatment) HPI.....Marland Kitchen sharp left flank pain radiating to left groin since yesterday, worse past 2 hours. No fever, chills, dysuria, hematuria. Past medical history significant for kidney stones times several episodes. Pain is moderate to severe. Nothing makes symptoms better or worse  Past Medical History  Diagnosis Date  . Kidney stones   . Kidney stones   . Kidney stones   . Peptic ulcer   . Hypertension    History reviewed. No pertinent past surgical history. History reviewed. No pertinent family history. History  Substance Use Topics  . Smoking status: Current Every Day Smoker -- 1.00 packs/day    Types: Cigarettes  . Smokeless tobacco: Not on file  . Alcohol Use: Yes     Comment: occasional    Review of Systems  All other systems reviewed and are negative.    Allergies  Tramadol hcl  Home Medications   Current Outpatient Rx  Name  Route  Sig  Dispense  Refill  . amoxicillin (AMOXIL) 500 MG capsule   Oral   Take 1 capsule (500 mg total) by mouth 3 (three) times daily.   30 capsule   0   . cephALEXin (KEFLEX) 500 MG capsule   Oral   Take 1 capsule (500 mg total) by mouth 4 (four) times daily.   28 capsule   0   . HYDROcodone-acetaminophen (NORCO/VICODIN) 5-325 MG per tablet   Oral   Take 1 tablet by mouth every 4 (four) hours as needed for pain.   10 tablet   0   . HYDROcodone-acetaminophen (NORCO/VICODIN) 5-325 MG per tablet   Oral   Take 1 tablet by mouth every 4 (four) hours as needed.   15 tablet   0   . ibuprofen (ADVIL,MOTRIN) 200 MG tablet   Oral   Take 800 mg by mouth every 6 (six) hours as needed for pain.         Marland Kitchen oxyCODONE-acetaminophen (PERCOCET) 5-325 MG per tablet  Oral   Take 2 tablets by mouth every 4 (four) hours as needed for pain.   20 tablet   0   . promethazine (PHENERGAN) 25 MG tablet   Oral   Take 1 tablet (25 mg total) by mouth every 6 (six) hours as needed for nausea.   20 tablet   0   . ranitidine (ZANTAC) 75 MG tablet   Oral   Take 75 mg by mouth 2 (two) times daily.          BP 167/104  Pulse 81  Temp(Src) 97.6 F (36.4 C) (Oral)  Resp 20  SpO2 97% Physical Exam  Nursing note and vitals reviewed. Constitutional: He is oriented to person, place, and time. He appears well-developed and well-nourished.  HENT:  Head: Normocephalic and atraumatic.  Eyes: Conjunctivae and EOM are normal. Pupils are equal, round, and reactive to light.  Neck: Normal range of motion. Neck supple.  Cardiovascular: Normal rate, regular rhythm and normal heart sounds.   Pulmonary/Chest: Effort normal and breath sounds normal.  Abdominal: Soft. Bowel sounds are normal.  Genitourinary:  Minimal left flank tenderness with radiation to left lower quadrant  Musculoskeletal: Normal range of motion.  Neurological: He is alert and oriented to person, place, and time.  Skin: Skin is  warm and dry.  Psychiatric: He has a normal mood and affect.    ED Course  Procedures (including critical care time) Labs Review Labs Reviewed  URINALYSIS, ROUTINE W REFLEX MICROSCOPIC - Abnormal; Notable for the following:    APPearance HAZY (*)    Specific Gravity, Urine >1.030 (*)    Hgb urine dipstick LARGE (*)    Bilirubin Urine SMALL (*)    Protein, ur 30 (*)    All other components within normal limits  URINE MICROSCOPIC-ADD ON - Abnormal; Notable for the following:    Bacteria, UA FEW (*)    All other components within normal limits   Imaging Review Dg Abd 1 View  11/26/2012   *RADIOLOGY REPORT*  Clinical Data: Flank pain  ABDOMEN - 1 VIEW  Comparison: 08/06/2012  Findings: Bilateral renal calculi are again identified.  Within the left side of pelvis there  is a faint calcific density measuring approximately 4.2 mm.  This may represent a distal ureteral calculus. The bowel gas pattern is normal.  No dilated loops of small bowel or air-fluid levels identified.  IMPRESSION:  1.  Bilateral renal calculi. 2.  Possible calcific density within the expected location of the distal left ureter.  This measures approximately 4 mm and may represent a ureteral calculus   Original Report Authenticated By: Signa Kell, M.D.    MDM   1. Kidney stone on left side    History and physical consistent with kidney stone. Patient feels better after pain management. Discharge meds Percocet and Phenergan 25 mg. Referral to urology     Donnetta Hutching, MD 11/26/12 [phone]

## 2013-01-20 ENCOUNTER — Emergency Department (HOSPITAL_COMMUNITY)
Admission: EM | Admit: 2013-01-20 | Discharge: 2013-01-20 | Disposition: A | Discharge status: Home / Self Care | Payer: Self-pay | Attending: Emergency Medicine | Admitting: Emergency Medicine

## 2013-01-20 ENCOUNTER — Encounter (HOSPITAL_COMMUNITY): Payer: Self-pay | Admitting: Emergency Medicine

## 2013-01-20 DIAGNOSIS — Z87442 Personal history of urinary calculi: Secondary | ICD-10-CM | POA: Insufficient documentation

## 2013-01-20 DIAGNOSIS — K029 Dental caries, unspecified: Secondary | ICD-10-CM | POA: Insufficient documentation

## 2013-01-20 DIAGNOSIS — K089 Disorder of teeth and supporting structures, unspecified: Secondary | ICD-10-CM | POA: Insufficient documentation

## 2013-01-20 DIAGNOSIS — Z8711 Personal history of peptic ulcer disease: Secondary | ICD-10-CM | POA: Insufficient documentation

## 2013-01-20 DIAGNOSIS — R51 Headache: Secondary | ICD-10-CM | POA: Insufficient documentation

## 2013-01-20 DIAGNOSIS — F17200 Nicotine dependence, unspecified, uncomplicated: Secondary | ICD-10-CM | POA: Insufficient documentation

## 2013-01-20 DIAGNOSIS — I10 Essential (primary) hypertension: Secondary | ICD-10-CM | POA: Insufficient documentation

## 2013-01-20 DIAGNOSIS — K0889 Other specified disorders of teeth and supporting structures: Secondary | ICD-10-CM

## 2013-01-20 MED ORDER — OXYCODONE-ACETAMINOPHEN 5-325 MG PO TABS
1.0000 | ORAL_TABLET | Freq: Once | ORAL | Status: AC
  Administered 2013-01-20: 1 via ORAL
  Filled 2013-01-20: qty 1

## 2013-01-20 MED ORDER — DICLOFENAC SODIUM 75 MG PO TBEC: 75.0000 mg | DELAYED_RELEASE_TABLET | Freq: Two times a day (BID) | ORAL | Status: DC

## 2013-01-20 MED ORDER — PENICILLIN V POTASSIUM 500 MG PO TABS: 500.0000 mg | ORAL_TABLET | Freq: Four times a day (QID) | ORAL | Status: AC

## 2013-01-20 NOTE — ED Notes (Signed)
Pain began yesterday after eating bacon and chipping L lower posterior molar.  Pain becoming more severe over course of day.  Has been taking 1000mg  Ibuprofen w/minimal relief.  Last dose was at 0300.

## 2013-01-20 NOTE — ED Provider Notes (Signed)
CSN: [id number]     Arrival date & time 01/20/13  3086 History   First MD Initiated Contact with Patient 01/20/13 [phone]     Chief Complaint  Patient presents with  . Dental Pain   (Consider location/radiation/quality/duration/timing/severity/associated sxs/prior Treatment) Patient is a 32 y.o. male presenting with tooth pain. The history is provided by the patient.  Dental Pain Location:  Lower Lower teeth location:  19/LL 1st molar and 18/LL 2nd molar Quality:  Throbbing and sharp Severity:  Moderate Onset quality:  Gradual Duration:  3 days Timing:  Constant Progression:  Worsening Chronicity:  New Context: dental caries and poor dentition   Context: not abscess and not trauma   Relieved by:  Nothing Worsened by:  Hot food/drink and cold food/drink Ineffective treatments:  NSAIDs Associated symptoms: facial pain   Associated symptoms: no congestion, no difficulty swallowing, no drooling, no facial swelling, no fever, no gum swelling, no headaches, no neck pain, no neck swelling, no oral bleeding, no oral lesions and no trismus   Risk factors: lack of dental care and smoking     Past Medical History  Diagnosis Date  . Kidney stones   . Kidney stones   . Kidney stones   . Peptic ulcer   . Hypertension    History reviewed. No pertinent past surgical history. History reviewed. No pertinent family history. History  Substance Use Topics  . Smoking status: Current Every Day Smoker -- 1.00 packs/day    Types: Cigarettes  . Smokeless tobacco: Not on file  . Alcohol Use: Yes     Comment: occasional    Review of Systems  Constitutional: Negative for fever and appetite change.  HENT: Positive for dental problem. Negative for congestion, drooling, facial swelling, mouth sores, sore throat and trouble swallowing.   Eyes: Negative for pain and visual disturbance.  Musculoskeletal: Negative for neck pain and neck stiffness.  Neurological: Negative for dizziness, facial asymmetry  and headaches.  Hematological: Negative for adenopathy.  All other systems reviewed and are negative.    Allergies  Tramadol hcl  Home Medications   Current Outpatient Rx  Name  Route  Sig  Dispense  Refill  . ibuprofen (ADVIL,MOTRIN) 200 MG tablet   Oral   Take 800 mg by mouth every 6 (six) hours as needed for pain.          BP 152/104  Pulse 73  Temp(Src) 97.6 F (36.4 C) (Oral)  Resp 18  Ht 6' (1.829 m)  Wt 200 lb (90.719 kg)  BMI 27.12 kg/m2  SpO2 99% Physical Exam  Nursing note and vitals reviewed. Constitutional: He is oriented to person, place, and time. He appears well-developed and well-nourished. No distress.  HENT:  Head: Normocephalic and atraumatic.  Right Ear: Tympanic membrane and ear canal normal.  Left Ear: Tympanic membrane and ear canal normal.  Mouth/Throat: Uvula is midline, oropharynx is clear and moist and mucous membranes are normal. No trismus in the jaw. Dental caries present. No dental abscesses or uvula swelling.  Dental caries of the left lower molars.  No facial swelling, obvious dental abscess, trismus, or sublingual abnml.    Neck: Normal range of motion. Neck supple.  Cardiovascular: Normal rate, regular rhythm and normal heart sounds.   No murmur heard. Pulmonary/Chest: Effort normal and breath sounds normal. No respiratory distress.  Musculoskeletal: Normal range of motion.  Lymphadenopathy:    He has no cervical adenopathy.  Neurological: He is alert and oriented to person, place, and time. He  exhibits normal muscle tone. Coordination normal.  Skin: Skin is warm and dry.    ED Course  Procedures (including critical care time) Labs Review Labs Reviewed - No data to display Imaging Review No results found.  EKG Interpretation   None       MDM    Left lower molar pain.  No trismus, facial edema, obvious dental abscess.  VSS.  Pt agrees to f/u with dentist, referral info given.     Adriel Desrosier L. Trisha Mangle, PA-C 01/22/13  2140

## 2013-01-20 NOTE — ED Notes (Signed)
Left lower dental pain x 3 days. Motrin with no relief. Nad. No swelling noted.

## 2013-01-20 NOTE — ED Notes (Signed)
Patient with no complaints at this time. Respirations even and unlabored. Skin warm/dry. Discharge instructions reviewed with patient at this time. Patient given opportunity to voice concerns/ask questions. Provided handouts on dental services.   Patient discharged at this time and left Emergency Department with steady gait.

## 2013-01-23 NOTE — ED Provider Notes (Signed)
Medical screening examination/treatment/procedure(s) were performed by non-physician practitioner and as supervising physician I was immediately available for consultation/collaboration.  EKG Interpretation   None         Narelle Schoening W. Montez Stryker, MD 01/23/13 0719 

## 2013-01-29 ENCOUNTER — Emergency Department (HOSPITAL_COMMUNITY)
Admission: EM | Admit: 2013-01-29 | Discharge: 2013-01-29 | Disposition: A | Discharge status: Home / Self Care | Payer: Self-pay | Attending: Emergency Medicine | Admitting: Emergency Medicine

## 2013-01-29 ENCOUNTER — Encounter (HOSPITAL_COMMUNITY): Payer: Self-pay | Admitting: Emergency Medicine

## 2013-01-29 DIAGNOSIS — Z87442 Personal history of urinary calculi: Secondary | ICD-10-CM | POA: Insufficient documentation

## 2013-01-29 DIAGNOSIS — K047 Periapical abscess without sinus: Secondary | ICD-10-CM | POA: Insufficient documentation

## 2013-01-29 DIAGNOSIS — Z8711 Personal history of peptic ulcer disease: Secondary | ICD-10-CM | POA: Insufficient documentation

## 2013-01-29 DIAGNOSIS — I10 Essential (primary) hypertension: Secondary | ICD-10-CM | POA: Insufficient documentation

## 2013-01-29 DIAGNOSIS — F17200 Nicotine dependence, unspecified, uncomplicated: Secondary | ICD-10-CM | POA: Insufficient documentation

## 2013-01-29 DIAGNOSIS — R51 Headache: Secondary | ICD-10-CM | POA: Insufficient documentation

## 2013-01-29 DIAGNOSIS — Z791 Long term (current) use of non-steroidal anti-inflammatories (NSAID): Secondary | ICD-10-CM | POA: Insufficient documentation

## 2013-01-29 DIAGNOSIS — R220 Localized swelling, mass and lump, head: Secondary | ICD-10-CM | POA: Insufficient documentation

## 2013-01-29 HISTORY — DX: Other specified disorders of teeth and supporting structures: K08.89

## 2013-01-29 MED ORDER — MELOXICAM 7.5 MG PO TABS: 7.5000 mg | ORAL_TABLET | Freq: Every day | ORAL | Status: DC

## 2013-01-29 MED ORDER — CLINDAMYCIN HCL 150 MG PO CAPS: 150.0000 mg | ORAL_CAPSULE | Freq: Four times a day (QID) | ORAL | Status: DC

## 2013-01-29 NOTE — ED Provider Notes (Signed)
CSN: [id number]     Arrival date & time 01/29/13  0808 History   First MD Initiated Contact with Patient 01/29/13 [phone]     Chief Complaint  Patient presents with  . Dental Pain   (Consider location/radiation/quality/duration/timing/severity/associated sxs/prior Treatment) Patient is a 32 y.o. male presenting with tooth pain. The history is provided by the patient.  Dental Pain Location:  Lower Lower teeth location:  18/LL 2nd molar Quality:  Sharp and throbbing Severity:  Severe Onset quality:  Gradual Duration:  1 week Timing:  Constant Progression:  Worsening Chronicity:  Recurrent Context: abscess and poor dentition   Relieved by:  Nothing Worsened by:  Nothing tried Ineffective treatments:  NSAIDs (amoxil) Associated symptoms: facial pain and gum swelling   Associated symptoms: no difficulty swallowing, no facial swelling, no fever, no neck pain, no neck swelling and no trismus     Past Medical History  Diagnosis Date  . Kidney stones   . Kidney stones   . Kidney stones   . Peptic ulcer   . Hypertension   . Pain, dental    History reviewed. No pertinent past surgical history. History reviewed. No pertinent family history. History  Substance Use Topics  . Smoking status: Current Every Day Smoker -- 1.00 packs/day    Types: Cigarettes  . Smokeless tobacco: Not on file  . Alcohol Use: Yes     Comment: occasional    Review of Systems  Constitutional: Negative for fever.  HENT: Positive for dental problem. Negative for facial swelling and sore throat.   Respiratory: Negative for shortness of breath.   Musculoskeletal: Negative for neck pain and neck stiffness.    Allergies  Tramadol hcl  Home Medications   Current Outpatient Rx  Name  Route  Sig  Dispense  Refill  . clindamycin (CLEOCIN) 150 MG capsule   Oral   Take 1 capsule (150 mg total) by mouth every 6 (six) hours.   28 capsule   0   . diclofenac (VOLTAREN) 75 MG EC tablet   Oral   Take 1  tablet (75 mg total) by mouth 2 (two) times daily. Take with food   14 tablet   0   . ibuprofen (ADVIL,MOTRIN) 200 MG tablet   Oral   Take 800 mg by mouth every 6 (six) hours as needed for pain.         . meloxicam (MOBIC) 7.5 MG tablet   Oral   Take 1-2 tablets (7.5-15 mg total) by mouth daily.   15 tablet   0    BP 155/107  Pulse 77  Temp(Src) 98.3 F (36.8 C) (Oral)  Resp 19  SpO2 97% Physical Exam  Constitutional: He is oriented to person, place, and time. He appears well-developed and well-nourished. No distress.  HENT:  Head: Normocephalic and atraumatic.  Right Ear: Tympanic membrane and external ear normal.  Left Ear: Tympanic membrane and external ear normal.  Mouth/Throat: Oropharynx is clear and moist and mucous membranes are normal. No oral lesions. No trismus in the jaw. Dental abscesses present.    Eyes: Conjunctivae are normal.  Neck: Normal range of motion. Neck supple.  Cardiovascular: Normal rate and normal heart sounds.   Pulmonary/Chest: Effort normal.  Abdominal: He exhibits no distension.  Musculoskeletal: Normal range of motion.  Lymphadenopathy:       Head (left side): Submandibular adenopathy present.    He has no cervical adenopathy.  Neurological: He is alert and oriented to person, place, and time.  Skin: Skin is warm and dry. No erythema.  Psychiatric: He has a normal mood and affect.    ED Course  Procedures (including critical care time) Labs Review Labs Reviewed - No data to display Imaging Review No results found.  EKG Interpretation   None       MDM   1. Dental abscess    Pt seen here for this same dental complaint 1 week ago. States is on last amoxil with no relief.  Has appt with dentist through [facility] 02/19/13.  Will given course of clindamycin,  Prescribed meloxicam for pain relief.  Prn f/u anticipated.  The patient appears reasonably screened and/or stabilized for discharge and I doubt any other medical  condition or other [facility] requiring further screening, evaluation, or treatment in the ED at this time prior to discharge.    [name], PA-C 01/29/13 [phone]

## 2013-01-29 NOTE — ED Notes (Signed)
Pt c/o left side upper and lower dental pain. States has one penicillin pill left from last visit and states pain is still present. No swelling noted. [facility]

## 2013-01-29 NOTE — ED Notes (Signed)
Discharge instructions given to pt, verbalized understanding of all, 2 scripts given, walked out unaided.

## 2013-01-29 NOTE — ED Provider Notes (Signed)
Medical screening examination/treatment/procedure(s) were performed by non-physician practitioner and as supervising physician I was immediately available for consultation/collaboration.  EKG Interpretation   None         Benny Lennert, MD 01/29/13 [phone]

## 2013-03-17 ENCOUNTER — Emergency Department (HOSPITAL_COMMUNITY)
Admission: EM | Admit: 2013-03-17 | Discharge: 2013-03-17 | Disposition: A | Discharge status: Home / Self Care | Payer: Self-pay | Attending: Emergency Medicine | Admitting: Emergency Medicine

## 2013-03-17 ENCOUNTER — Encounter (HOSPITAL_COMMUNITY): Payer: Self-pay | Admitting: Emergency Medicine

## 2013-03-17 DIAGNOSIS — R6883 Chills (without fever): Secondary | ICD-10-CM | POA: Insufficient documentation

## 2013-03-17 DIAGNOSIS — F17200 Nicotine dependence, unspecified, uncomplicated: Secondary | ICD-10-CM | POA: Insufficient documentation

## 2013-03-17 DIAGNOSIS — H6590 Unspecified nonsuppurative otitis media, unspecified ear: Secondary | ICD-10-CM | POA: Insufficient documentation

## 2013-03-17 DIAGNOSIS — I10 Essential (primary) hypertension: Secondary | ICD-10-CM | POA: Insufficient documentation

## 2013-03-17 DIAGNOSIS — Z8711 Personal history of peptic ulcer disease: Secondary | ICD-10-CM | POA: Insufficient documentation

## 2013-03-17 DIAGNOSIS — Z87442 Personal history of urinary calculi: Secondary | ICD-10-CM | POA: Insufficient documentation

## 2013-03-17 DIAGNOSIS — Z8719 Personal history of other diseases of the digestive system: Secondary | ICD-10-CM | POA: Insufficient documentation

## 2013-03-17 DIAGNOSIS — H6691 Otitis media, unspecified, right ear: Secondary | ICD-10-CM

## 2013-03-17 DIAGNOSIS — R112 Nausea with vomiting, unspecified: Secondary | ICD-10-CM | POA: Insufficient documentation

## 2013-03-17 MED ORDER — GUAIFENESIN-CODEINE 100-10 MG/5ML PO SYRP: 10.0000 mL | ORAL_SOLUTION | Freq: Three times a day (TID) | ORAL | Status: DC | PRN

## 2013-03-17 MED ORDER — AMOXICILLIN 500 MG PO CAPS: 500.0000 mg | ORAL_CAPSULE | Freq: Three times a day (TID) | ORAL | Status: DC

## 2013-03-17 MED ORDER — IBUPROFEN 800 MG PO TABS: 800.0000 mg | ORAL_TABLET | Freq: Three times a day (TID) | ORAL | Status: DC

## 2013-03-17 NOTE — ED Notes (Signed)
Sore throat, cough, ear pain,  Vomited last night . Nasal congestion

## 2013-03-18 NOTE — ED Provider Notes (Signed)
CSN: [id number]     Arrival date & time 03/17/13  1529 History   First MD Initiated Contact with Patient 03/17/13 1602     Chief Complaint  Patient presents with  . Sore Throat   (Consider location/radiation/quality/duration/timing/severity/associated sxs/prior Treatment) Patient is a 32 y.o. male presenting with pharyngitis. The history is provided by the patient.  Sore Throat This is a new problem. The current episode started yesterday. The problem occurs constantly. The problem has been gradually worsening. Associated symptoms include chills, congestion, coughing, nausea, a sore throat and vomiting. Pertinent negatives include no abdominal pain, chest pain, diaphoresis, fever, headaches, myalgias, neck pain, numbness, rash, swollen glands, urinary symptoms or weakness. Associated symptoms comments: Ear pain. Nothing aggravates the symptoms. Treatments tried: OTC cold medications, ibuprofen. The treatment provided no relief.    Past Medical History  Diagnosis Date  . Peptic ulcer   . Hypertension   . Pain, dental   . Kidney stones   . Kidney stones   . Kidney stones    History reviewed. No pertinent past surgical history. History reviewed. No pertinent family history. History  Substance Use Topics  . Smoking status: Current Every Day Smoker -- 1.00 packs/day    Types: Cigarettes  . Smokeless tobacco: Not on file  . Alcohol Use: Yes     Comment: occasional    Review of Systems  Constitutional: Positive for chills. Negative for fever, diaphoresis, activity change and appetite change.  HENT: Positive for congestion, ear pain, rhinorrhea and sore throat. Negative for facial swelling and trouble swallowing.   Eyes: Negative for visual disturbance.  Respiratory: Positive for cough. Negative for chest tightness, shortness of breath, wheezing and stridor.   Cardiovascular: Negative for chest pain.  Gastrointestinal: Positive for nausea and vomiting. Negative for abdominal pain.   Musculoskeletal: Negative for myalgias, neck pain and neck stiffness.  Skin: Negative.  Negative for rash.  Neurological: Negative for dizziness, weakness, numbness and headaches.  Hematological: Negative for adenopathy.  Psychiatric/Behavioral: Negative for confusion.  All other systems reviewed and are negative.    Allergies  Tramadol hcl  Home Medications   Current Outpatient Rx  Name  Route  Sig  Dispense  Refill  . ibuprofen (ADVIL,MOTRIN) 200 MG tablet   Oral   Take 800 mg by mouth every 6 (six) hours as needed for pain.         . Pseudoeph-Doxylamine-DM-APAP (DAYQUIL/NYQUIL COLD/FLU RELIEF PO)   Oral   Take 2 capsules by mouth daily as needed (for cold and flu symptoms).         Marland Kitchen amoxicillin (AMOXIL) 500 MG capsule   Oral   Take 1 capsule (500 mg total) by mouth 3 (three) times daily. For 10 days   30 capsule   0   . guaiFENesin-codeine (ROBITUSSIN AC) 100-10 MG/5ML syrup   Oral   Take 10 mLs by mouth 3 (three) times daily as needed for cough.   120 mL   0   . ibuprofen (ADVIL,MOTRIN) 800 MG tablet   Oral   Take 1 tablet (800 mg total) by mouth 3 (three) times daily.   21 tablet   0    BP 147/96  Pulse 85  Temp(Src) 98 F (36.7 C) (Oral)  Resp 20  Ht 6' (1.829 m)  Wt 200 lb (90.719 kg)  BMI 27.12 kg/m2  SpO2 99% Physical Exam  Nursing note and vitals reviewed. Constitutional: He is oriented to person, place, and time. He appears well-developed and well-nourished. No  distress.  HENT:  Head: Normocephalic and atraumatic.  Mouth/Throat: Uvula is midline, oropharynx is clear and moist and mucous membranes are normal. No uvula swelling. No oropharyngeal exudate.  Erythema of the right TM.  Mild middle ear effusion present.  No drainage or edema of the ear canal, TM appears intact.    Neck: Normal range of motion. Neck supple.  Cardiovascular: Normal rate, regular rhythm, normal heart sounds and intact distal pulses.   No murmur  heard. Pulmonary/Chest: Effort normal and breath sounds normal. No stridor. No respiratory distress. He has no wheezes. He has no rales. He exhibits no tenderness.  Coarse lung sounds bilaterally w/o wheezes, rales   Musculoskeletal: Normal range of motion.  Lymphadenopathy:    He has no cervical adenopathy.  Neurological: He is alert and oriented to person, place, and time. Coordination normal.  Skin: Skin is warm and dry. No rash noted.    ED Course  Procedures (including critical care time) Labs Review Labs Reviewed - No data to display Imaging Review No results found.  EKG Interpretation   None       MDM   1. Otitis media, right     Patient agrees to symptomatic treatment with robitussin AC, ibuprofen and amoxil for acute right OM.  Appears stable for discharge.    Maleea Camilo L. Trisha Mangle, PA-C 03/18/13 [phone]

## 2013-03-18 NOTE — ED Provider Notes (Signed)
  Medical screening examination/treatment/procedure(s) were performed by non-physician practitioner and as supervising physician I was immediately available for consultation/collaboration.  EKG Interpretation   None          Antoria Lanza, MD 03/18/13 1600 

## 2013-03-27 ENCOUNTER — Encounter (HOSPITAL_COMMUNITY): Payer: Self-pay | Admitting: Emergency Medicine

## 2013-03-27 ENCOUNTER — Emergency Department (HOSPITAL_COMMUNITY)
Admission: EM | Admit: 2013-03-27 | Discharge: 2013-03-27 | Disposition: A | Discharge status: Home / Self Care | Payer: Self-pay | Attending: Emergency Medicine | Admitting: Emergency Medicine

## 2013-03-27 DIAGNOSIS — J3489 Other specified disorders of nose and nasal sinuses: Secondary | ICD-10-CM | POA: Insufficient documentation

## 2013-03-27 DIAGNOSIS — Z87442 Personal history of urinary calculi: Secondary | ICD-10-CM | POA: Insufficient documentation

## 2013-03-27 DIAGNOSIS — Z79899 Other long term (current) drug therapy: Secondary | ICD-10-CM | POA: Insufficient documentation

## 2013-03-27 DIAGNOSIS — H9190 Unspecified hearing loss, unspecified ear: Secondary | ICD-10-CM | POA: Insufficient documentation

## 2013-03-27 DIAGNOSIS — I10 Essential (primary) hypertension: Secondary | ICD-10-CM | POA: Insufficient documentation

## 2013-03-27 DIAGNOSIS — Z860100 Personal history of colon polyps, unspecified: Secondary | ICD-10-CM | POA: Insufficient documentation

## 2013-03-27 DIAGNOSIS — F17200 Nicotine dependence, unspecified, uncomplicated: Secondary | ICD-10-CM | POA: Insufficient documentation

## 2013-03-27 DIAGNOSIS — Z8719 Personal history of other diseases of the digestive system: Secondary | ICD-10-CM | POA: Insufficient documentation

## 2013-03-27 DIAGNOSIS — H6590 Unspecified nonsuppurative otitis media, unspecified ear: Secondary | ICD-10-CM | POA: Insufficient documentation

## 2013-03-27 DIAGNOSIS — Z792 Long term (current) use of antibiotics: Secondary | ICD-10-CM | POA: Insufficient documentation

## 2013-03-27 MED ORDER — PREDNISONE 10 MG PO TABS: ORAL_TABLET | ORAL | Status: DC

## 2013-03-27 MED ORDER — PREDNISONE 10 MG PO TABS
60.0000 mg | ORAL_TABLET | Freq: Once | ORAL | Status: AC
  Administered 2013-03-27: 60 mg via ORAL
  Filled 2013-03-27 (×2): qty 1

## 2013-03-27 MED ORDER — ANTIPYRINE-BENZOCAINE 5.4-1.4 % OT SOLN
3.0000 [drp] | Freq: Once | OTIC | Status: AC
  Administered 2013-03-27: 4 [drp] via OTIC
  Filled 2013-03-27: qty 10

## 2013-03-27 NOTE — Discharge Instructions (Signed)
Ear Drops, Adult You need to put eardrops in your ear. HOME CARE   Put drops in your affected ear as told.  After putting in the drops, lay down with the ear you put the drops in facing up. Do this for 10 minutes. Use the ear drops as long as your doctor tells you.  Before you get up, put a cotton ball gently in your ear. Do not push it far in your ear.  Do not wash out your ears unless your doctor says it is okay.  Finish all medicines as told by your doctor. You may be told to keep using the eardrops even if you start to feel better.  See your doctor as told for follow-up visits. GET HELP IF:  You have pain that gets worse.  Any unusual fluid (drainage) is coming from your ear (especially if the fluid stinks).  You have trouble hearing.  You get really dizzy as if the room is spinning and feel sick to your stomach (vertigo).  The outside of your ear becomes red or puffy or both. This may be a sign of an allergic reaction. MAKE SURE YOU:   Understand these instructions.  Will watch your condition.  Will get help right away if you are not doing well or get worse. Document Released: 08/24/2009 Document Revised: 11/06/2012 Document Reviewed: 10/01/2012 [facility] Patient Information 2014 [place], [place].  Serous Otitis Media  Serous otitis media is fluid in the middle ear space. This space contains the bones for hearing and air. Air in the middle ear space helps to transmit sound.  The air gets there through the eustachian tube. This tube goes from the back of the nose (nasopharynx) to the middle ear space. It keeps the pressure in the middle ear the same as the outside world. It also helps to drain fluid from the middle ear space. CAUSES  Serous otitis media occurs when the eustachian tube gets blocked. Blockage can come from:  Ear infections.  Colds and other upper respiratory infections.  Allergies.  Irritants such as cigarette smoke.  Sudden changes in air pressure  (such as descending in an airplane).  Enlarged adenoids.  A mass in the nasopharynx. During colds and upper respiratory infections, the middle ear space can become temporarily filled with fluid. This can happen after an ear infection also. Once the infection clears, the fluid will generally drain out of the ear through the eustachian tube. If it does not, then serous otitis media occurs. SIGNS AND SYMPTOMS   Hearing loss.  A feeling of fullness in the ear, without pain.  Young children may not show any symptoms but may show slight behavioral changes, such as agitation, ear pulling, or crying. DIAGNOSIS  Serous otitis media is diagnosed by an ear exam. Tests may be done to check on the movement of the eardrum. Hearing exams may also be done. TREATMENT  The fluid most often goes away without treatment. If allergy is the cause, allergy treatment may be helpful. Fluid that persists for several months may require minor surgery. A small tube is placed in the eardrum to:  Drain the fluid.  Restore the air in the middle ear space. In certain situations, antibiotics are used to avoid surgery. Surgery may be done to remove enlarged adenoids (if this is the cause). HOME CARE INSTRUCTIONS   Keep children away from tobacco smoke.  Be sure to keep any follow-up appointments. SEEK MEDICAL CARE IF:   Your hearing is not better in 3  months.  Your hearing is worse.  You have ear pain.  You have drainage from the ear.  You have dizziness.  You have serous otitis media only in one ear or have any bleeding from your nose (epistaxis).  You notice a lump on your neck. MAKE SURE YOU:  Understand these instructions.   Will watch your condition.   Will get help right away if you are not doing well or get worse.  Document Released: 05/27/2003 Document Revised: 11/06/2012 Document Reviewed: 10/01/2012 [facility] Patient Information 2014 [place], [place].

## 2013-03-27 NOTE — ED Notes (Signed)
Pt reports continuing ear pain. Pt states he was seen here a couple of weeks ago and told to come back after he finished his abx if symptoms did not resolve.

## 2013-03-28 NOTE — ED Provider Notes (Signed)
CSN: [id number]     Arrival date & time 03/27/13  1303 History   First MD Initiated Contact with Patient 03/27/13 1338     Chief Complaint  Patient presents with  . Otalgia   (Consider location/radiation/quality/duration/timing/severity/associated sxs/prior Treatment) Patient is a 33 y.o. male presenting with ear pain. The history is provided by the patient.  Otalgia Location:  Bilateral Behind ear:  No abnormality Quality:  Aching and pressure Severity:  Moderate Onset quality:  Gradual Duration:  1 week Timing:  Constant Progression:  Unchanged Chronicity:  Recurrent Context comment:  Recent viral illness and nasal congestion Relieved by:  Nothing Worsened by:  Nothing tried Associated symptoms: congestion, hearing loss and tinnitus   Associated symptoms: no abdominal pain, no cough, no ear discharge, no fever, no headaches, no neck pain, no rash, no rhinorrhea, no sore throat and no vomiting   Congestion:    Location:  Nasal   Interferes with sleep: no     Interferes with eating/drinking: no     Past Medical History  Diagnosis Date  . Peptic ulcer   . Hypertension   . Pain, dental   . Kidney stones   . Kidney stones   . Kidney stones    History reviewed. No pertinent past surgical history. History reviewed. No pertinent family history. History  Substance Use Topics  . Smoking status: Current Every Day Smoker -- 1.00 packs/day    Types: Cigarettes  . Smokeless tobacco: Not on file  . Alcohol Use: Yes     Comment: occasional    Review of Systems  Constitutional: Negative for fever, chills, activity change and appetite change.  HENT: Positive for congestion, ear pain, hearing loss and tinnitus. Negative for ear discharge, facial swelling, rhinorrhea, sore throat and trouble swallowing.   Eyes: Negative for visual disturbance.  Respiratory: Negative for cough, shortness of breath, wheezing and stridor.   Gastrointestinal: Negative for nausea, vomiting and  abdominal pain.  Musculoskeletal: Negative for neck pain and neck stiffness.  Skin: Negative.  Negative for rash.  Neurological: Negative for dizziness, weakness, numbness and headaches.  Hematological: Negative for adenopathy.  Psychiatric/Behavioral: Negative for confusion.  All other systems reviewed and are negative.    Allergies  Tramadol hcl  Home Medications   Current Outpatient Rx  Name  Route  Sig  Dispense  Refill  . ranitidine (ZANTAC) 75 MG tablet   Oral   Take 75 mg by mouth 2 (two) times daily.         Marland Kitchen. amoxicillin (AMOXIL) 500 MG capsule   Oral   Take 1 capsule (500 mg total) by mouth 3 (three) times daily. For 10 days   30 capsule   0   . predniSONE (DELTASONE) 10 MG tablet      Take 6 tablets day one, 5 tablets day two, 4 tablets day three, 3 tablets day four, 2 tablets day five, then 1 tablet day six   21 tablet   0    BP 165/96  Pulse 82  Temp(Src) 98 F (36.7 C) (Oral)  Resp 14  Ht 6' (1.829 m)  Wt 200 lb (90.719 kg)  BMI 27.12 kg/m2  SpO2 100% Physical Exam  Nursing note and vitals reviewed. Constitutional: He is oriented to person, place, and time. He appears well-developed and well-nourished. No distress.  HENT:  Head: Normocephalic and atraumatic.  Mouth/Throat: Uvula is midline, oropharynx is clear and moist and mucous membranes are normal. No uvula swelling. No oropharyngeal exudate.  Bilateral  Mild middle ear effusion present.  No drainage or edema of the ear canal, TM appears intact.    Neck: Normal range of motion. Neck supple.  Cardiovascular: Normal rate, regular rhythm, normal heart sounds and intact distal pulses.   No murmur heard. Pulmonary/Chest: Effort normal and breath sounds normal. No stridor. No respiratory distress.  Lymphadenopathy:    He has no cervical adenopathy.  Neurological: He is alert and oriented to person, place, and time. Coordination normal.  Skin: Skin is warm and dry. No rash noted.    ED Course   Procedures (including critical care time) Labs Review Labs Reviewed - No data to display Imaging Review No results found.  EKG Interpretation   None       MDM   1. Serous otitis media, bilateral    Auralgan otic drops dispensed.  Patient agrees to drops and prednisone taper.  VSS.  No vertiginous sx's.  He appears stable for discharge    Anthony Roland L. Trisha Mangle, PA-C 03/28/13 2153

## 2013-04-05 NOTE — ED Provider Notes (Signed)
Medical screening examination/treatment/procedure(s) were performed by non-physician practitioner and as supervising physician I was immediately available for consultation/collaboration.  EKG Interpretation   None         Elward Nocera, MD 04/05/13 0654 

## 2013-10-23 ENCOUNTER — Emergency Department (HOSPITAL_COMMUNITY)
Admission: EM | Admit: 2013-10-23 | Discharge: 2013-10-23 | Disposition: A | Discharge status: Home / Self Care | Payer: Self-pay | Attending: Emergency Medicine | Admitting: Emergency Medicine

## 2013-10-23 ENCOUNTER — Encounter (HOSPITAL_COMMUNITY): Payer: Self-pay | Admitting: Emergency Medicine

## 2013-10-23 DIAGNOSIS — Z87442 Personal history of urinary calculi: Secondary | ICD-10-CM | POA: Insufficient documentation

## 2013-10-23 DIAGNOSIS — K029 Dental caries, unspecified: Secondary | ICD-10-CM | POA: Insufficient documentation

## 2013-10-23 DIAGNOSIS — Z791 Long term (current) use of non-steroidal anti-inflammatories (NSAID): Secondary | ICD-10-CM | POA: Insufficient documentation

## 2013-10-23 DIAGNOSIS — F17200 Nicotine dependence, unspecified, uncomplicated: Secondary | ICD-10-CM | POA: Insufficient documentation

## 2013-10-23 DIAGNOSIS — R599 Enlarged lymph nodes, unspecified: Secondary | ICD-10-CM | POA: Insufficient documentation

## 2013-10-23 DIAGNOSIS — Z79899 Other long term (current) drug therapy: Secondary | ICD-10-CM | POA: Insufficient documentation

## 2013-10-23 DIAGNOSIS — K047 Periapical abscess without sinus: Secondary | ICD-10-CM

## 2013-10-23 DIAGNOSIS — Z8719 Personal history of other diseases of the digestive system: Secondary | ICD-10-CM | POA: Insufficient documentation

## 2013-10-23 DIAGNOSIS — K089 Disorder of teeth and supporting structures, unspecified: Secondary | ICD-10-CM | POA: Insufficient documentation

## 2013-10-23 DIAGNOSIS — I10 Essential (primary) hypertension: Secondary | ICD-10-CM | POA: Insufficient documentation

## 2013-10-23 MED ORDER — HYDROCODONE-ACETAMINOPHEN 5-325 MG PO TABS: 1.0000 | ORAL_TABLET | ORAL | Status: DC | PRN

## 2013-10-23 MED ORDER — OXYCODONE-ACETAMINOPHEN 5-325 MG PO TABS
1.0000 | ORAL_TABLET | Freq: Once | ORAL | Status: AC
Start: 2013-10-23 — End: 2013-10-23
  Administered 2013-10-23: 1 via ORAL
  Filled 2013-10-23: qty 1

## 2013-10-23 MED ORDER — NAPROXEN 500 MG PO TABS: 500.0000 mg | ORAL_TABLET | Freq: Two times a day (BID) | ORAL | Status: DC

## 2013-10-23 MED ORDER — AMOXICILLIN 500 MG PO CAPS: 500.0000 mg | ORAL_CAPSULE | Freq: Three times a day (TID) | ORAL | Status: DC

## 2013-10-23 MED ORDER — AMOXICILLIN 250 MG PO CAPS
500.0000 mg | ORAL_CAPSULE | Freq: Once | ORAL | Status: AC
  Administered 2013-10-23: 500 mg via ORAL
  Filled 2013-10-23: qty 2

## 2013-10-23 NOTE — ED Notes (Signed)
Pt with L. Side  dental pain for about a week.

## 2013-10-23 NOTE — ED Provider Notes (Signed)
CSN: [id number]     Arrival date & time 10/23/13  1141 History   First MD Initiated Contact with Patient 10/23/13 1240     Chief Complaint  Patient presents with  . Dental Pain     (Consider location/radiation/quality/duration/timing/severity/associated sxs/prior Treatment) Patient is a 33 y.o. male presenting with tooth pain. The history is provided by the patient.  Dental Pain Location:  Lower Lower teeth location:  17/LL 3rd molar and 18/LL 2nd molar Quality:  Throbbing and constant Severity:  Severe Onset quality:  Gradual Duration:  1 week Timing:  Constant Progression:  Worsening Chronicity:  New Context: abscess   Relieved by:  Nothing Worsened by:  Cold food/drink, touching, pressure and jaw movement Ineffective treatments:  Acetaminophen and NSAIDs Associated symptoms: facial pain    Jeremy Nichols is a 33 y.o. male who presents to the ED with dental pain that started about a week ago and has gotten progressively worse. He complains of swelling of the gum around the tooth and facial pain.  Past Medical History  Diagnosis Date  . Peptic ulcer   . Hypertension   . Pain, dental   . Kidney stones   . Kidney stones   . Kidney stones    History reviewed. No pertinent past surgical history. No family history on file. History  Substance Use Topics  . Smoking status: Current Every Day Smoker -- 1.00 packs/day    Types: Cigarettes  . Smokeless tobacco: Not on file  . Alcohol Use: Yes     Comment: occasional    Review of Systems Negative except as stated in HPI   Allergies  Tramadol hcl  Home Medications   Prior to Admission medications   Medication Sig Start Date End Date Taking? Authorizing Provider  ranitidine (ZANTAC) 75 MG tablet Take 75 mg by mouth 2 (two) times daily.   Yes Historical Provider, MD  amoxicillin (AMOXIL) 500 MG capsule Take 1 capsule (500 mg total) by mouth 3 (three) times daily. 10/23/13   Kiree Dejarnette Orlene Och, NP  HYDROcodone-acetaminophen  (NORCO/VICODIN) 5-325 MG per tablet Take 1 tablet by mouth every 4 (four) hours as needed. 10/23/13   Glennda Weatherholtz Orlene Och, NP  naproxen (NAPROSYN) 500 MG tablet Take 1 tablet (500 mg total) by mouth 2 (two) times daily. 10/23/13   Karmella Bouvier Orlene Och, NP   BP 172/95  Pulse 99  Temp(Src) 98.3 F (36.8 C) (Oral)  Resp 16  SpO2 99% Physical Exam  Nursing note and vitals reviewed. Constitutional: He is oriented to person, place, and time. He appears well-developed and well-nourished.  HENT:  Right Ear: Tympanic membrane normal.  Left Ear: Tympanic membrane normal.  Mouth/Throat: Uvula is midline, oropharynx is clear and moist and mucous membranes are normal.    Dental decay that extends to the gum. Swelling, erythema and tenderness of the gum surrounding the second and third molar.   Eyes: Conjunctivae and EOM are normal.  Neck: Neck supple.  Cardiovascular: Normal rate.   Pulmonary/Chest: Effort normal.  Abdominal: Soft. There is no tenderness.  Musculoskeletal: Normal range of motion.  Lymphadenopathy:    He has cervical adenopathy.  Neurological: He is alert and oriented to person, place, and time. No cranial nerve deficit.  Skin: Skin is warm and dry.  Psychiatric: He has a normal mood and affect. His behavior is normal.    ED Course  Procedures  MDM  33 y.o. male with dental pain due to abscess x one week. Will treat with antibiotics and  pain medication and he is to follow up with a dentist as soon as possible. Discussed with the patient and all questioned fully answered.   Medication List    TAKE these medications       amoxicillin 500 MG capsule  Commonly known as:  AMOXIL  Take 1 capsule (500 mg total) by mouth 3 (three) times daily.     HYDROcodone-acetaminophen 5-325 MG per tablet  Commonly known as:  NORCO/VICODIN  Take 1 tablet by mouth every 4 (four) hours as needed.     naproxen 500 MG tablet  Commonly known as:  NAPROSYN  Take 1 tablet (500 mg total) by mouth 2 (two)  times daily.      ASK your doctor about these medications       ranitidine 75 MG tablet  Commonly known as:  ZANTAC  Take 75 mg by mouth 2 (two) times daily.         [facility] Orlene OchM Graison Leinberger, [place] 10/23/13 [phone]

## 2013-10-23 NOTE — Discharge Instructions (Signed)
Abscessed Tooth An abscessed tooth is an infection around your tooth. It may be caused by holes or damage to the tooth (cavity) or a dental disease. An abscessed tooth causes mild to very bad pain in and around the tooth. See your dentist right away if you have tooth or gum pain. HOME CARE  Take your medicine as told. Finish it even if you start to feel better.  Do not drive after taking pain medicine.  Rinse your mouth (gargle) often with salt water ( teaspoon salt in 8 ounces of warm water).  Do not apply heat to the outside of your face. GET HELP RIGHT AWAY IF:   You have a temperature by mouth above 102 F (38.9 C), not controlled by medicine.  You have chills and a very bad headache.  You have problems breathing or swallowing.  Your mouth will not open.  You develop puffiness (swelling) on the neck or around the eye.  Your pain is not helped by medicine.  Your pain is getting worse instead of better. MAKE SURE YOU:   Understand these instructions.  Will watch your condition.  Will get help right away if you are not doing well or get worse. Document Released: 08/23/2007 Document Revised: 05/29/2011 Document Reviewed: 06/14/2010 [facility] Patient Information 2015 [place], [place]. This information is not intended to replace advice given to you by your health care provider. Make sure you discuss any questions you have with your health care provider.  Dental Care and Dentist Visits Dental care supports good overall health. Regular dental visits can also help you avoid dental pain, bleeding, infection, and other more serious health problems in the future. It is important to keep the mouth healthy because diseases in the teeth, gums, and other oral tissues can spread to other areas of the body. Some problems, such as diabetes, heart disease, and pre-term labor have been associated with poor oral health.  See your dentist every 6 months. If you experience emergency problems such  as a toothache or broken tooth, go to the dentist right away. If you see your dentist regularly, you may catch problems early. It is easier to be treated for problems in the early stages.  WHAT TO EXPECT AT A DENTIST VISIT  Your dentist will look for many common oral health problems and recommend proper treatment. At your regular dental visit, you can expect:  Gentle cleaning of the teeth and gums. This includes scraping and polishing. This helps to remove the sticky substance around the teeth and gums (plaque). Plaque forms in the mouth shortly after eating. Over time, plaque hardens on the teeth as tartar. If tartar is not removed regularly, it can cause problems. Cleaning also helps remove stains.  Periodic X-rays. These pictures of the teeth and supporting bone will help your dentist assess the health of your teeth.  Periodic fluoride treatments. Fluoride is a natural mineral shown to help strengthen teeth. Fluoride treatmentinvolves applying a fluoride gel or varnish to the teeth. It is most commonly done in children.  Examination of the mouth, tongue, jaws, teeth, and gums to look for any oral health problems, such as:  Cavities (dental caries). This is decay on the tooth caused by plaque, sugar, and acid in the mouth. It is best to catch a cavity when it is small.  Inflammation of the gums caused by plaque buildup (gingivitis).  Problems with the mouth or malformed or misaligned teeth.  Oral cancer or other diseases of the soft tissues or jaws.  KEEP YOUR TEETH AND GUMS HEALTHY °For healthy teeth and gums, follow these general guidelines as well as your dentist's specific advice: °· Have your teeth professionally cleaned at the dentist every 6 months. °· Brush twice daily with a fluoride toothpaste. °· Floss your teeth daily.  °· Ask your dentist if you need fluoride supplements, treatments, or fluoride toothpaste. °· Eat a healthy diet. Reduce foods and drinks with added sugar. °· Avoid  smoking. °TREATMENT FOR ORAL HEALTH PROBLEMS °If you have oral health problems, treatment varies depending on the conditions present in your teeth and gums. °· Your caregiver will most likely recommend good oral hygiene at each visit. °· For cavities, gingivitis, or other oral health disease, your caregiver will perform a procedure to treat the problem. This is typically done at a separate appointment. Sometimes your caregiver will refer you to another dental specialist for specific tooth problems or for surgery. °SEEK IMMEDIATE DENTAL CARE IF: °· You have pain, bleeding, or soreness in the gum, tooth, jaw, or mouth area. °· A permanent tooth becomes loose or separated from the gum socket. °· You experience a blow or injury to the mouth or jaw area. °Document Released: 11/16/2010 Document Revised: 05/29/2011 Document Reviewed: 11/16/2010 °[employer]® Patient Information ©2015 [employer]. This information is not intended to replace advice given to you by your health care provider. Make sure you discuss any questions you have with your health care provider. ° °

## 2013-10-23 NOTE — ED Notes (Signed)
Dental pain x 1 week.  

## 2013-10-23 NOTE — ED Notes (Signed)
Dental pain to left back lower jaw.  Rates pain 9.

## 2013-10-23 NOTE — ED Notes (Signed)
NP at bedside.

## 2013-10-24 NOTE — ED Provider Notes (Signed)
Medical screening examination/treatment/procedure(s) were performed by non-physician practitioner and as supervising physician I was immediately available for consultation/collaboration.   EKG Interpretation None        Jeffren Dombek N Jameir Ake, DO 10/24/13 0912 

## 2014-01-01 ENCOUNTER — Encounter (HOSPITAL_COMMUNITY): Payer: Self-pay | Admitting: Emergency Medicine

## 2014-01-01 ENCOUNTER — Emergency Department (HOSPITAL_COMMUNITY): Payer: Self-pay

## 2014-01-01 ENCOUNTER — Emergency Department (HOSPITAL_COMMUNITY)
Admission: EM | Admit: 2014-01-01 | Discharge: 2014-01-01 | Disposition: A | Discharge status: Home / Self Care | Payer: Self-pay | Attending: Emergency Medicine | Admitting: Emergency Medicine

## 2014-01-01 DIAGNOSIS — S63601A Unspecified sprain of right thumb, initial encounter: Secondary | ICD-10-CM | POA: Insufficient documentation

## 2014-01-01 DIAGNOSIS — Y990 Civilian activity done for income or pay: Secondary | ICD-10-CM | POA: Insufficient documentation

## 2014-01-01 DIAGNOSIS — Z7982 Long term (current) use of aspirin: Secondary | ICD-10-CM | POA: Insufficient documentation

## 2014-01-01 DIAGNOSIS — Z79899 Other long term (current) drug therapy: Secondary | ICD-10-CM | POA: Insufficient documentation

## 2014-01-01 DIAGNOSIS — Y9389 Activity, other specified: Secondary | ICD-10-CM | POA: Insufficient documentation

## 2014-01-01 DIAGNOSIS — Z8719 Personal history of other diseases of the digestive system: Secondary | ICD-10-CM | POA: Insufficient documentation

## 2014-01-01 DIAGNOSIS — I10 Essential (primary) hypertension: Secondary | ICD-10-CM | POA: Insufficient documentation

## 2014-01-01 DIAGNOSIS — Z87442 Personal history of urinary calculi: Secondary | ICD-10-CM | POA: Insufficient documentation

## 2014-01-01 DIAGNOSIS — Y9289 Other specified places as the place of occurrence of the external cause: Secondary | ICD-10-CM | POA: Insufficient documentation

## 2014-01-01 DIAGNOSIS — X58XXXA Exposure to other specified factors, initial encounter: Secondary | ICD-10-CM | POA: Insufficient documentation

## 2014-01-01 DIAGNOSIS — Z720 Tobacco use: Secondary | ICD-10-CM | POA: Insufficient documentation

## 2014-01-01 MED ORDER — NAPROXEN 500 MG PO TABS: 500.0000 mg | ORAL_TABLET | Freq: Two times a day (BID) | ORAL | Status: DC

## 2014-01-01 MED ORDER — HYDROCODONE-ACETAMINOPHEN 5-325 MG PO TABS: ORAL_TABLET | ORAL | Status: DC

## 2014-01-01 NOTE — ED Notes (Signed)
Pt verbalized understanding to use caution and no driving within 4 hours of taking pain med due to med causes drowsiness  

## 2014-01-01 NOTE — ED Notes (Signed)
Injury to rt thumb , injury on a wheel at work

## 2014-01-01 NOTE — Discharge Instructions (Signed)
Finger Sprain °A finger sprain happens when the bands of tissue that hold the finger bones together (ligaments) stretch too much and tear. °HOME CARE °· Keep your injured finger raised (elevated) when possible. °· Put ice on the injured area, twice a day, for 2 to 3 days. °¨ Put ice in a plastic bag. °¨ Place a towel between your skin and the bag. °¨ Leave the ice on for 15 minutes. °· Only take medicine as told by your doctor. °· Do not wear rings on the injured finger. °· Protect your finger until pain and stiffness go away (usually 3 to 4 weeks). °· Do not get your cast or splint to get wet. Cover your cast or splint with a plastic bag when you shower or bathe. Do not swim. °· Your doctor may suggest special exercises for you to do. These exercises will help keep or stop stiffness from happening. °GET HELP RIGHT AWAY IF: °· Your cast or splint gets damaged. °· Your pain gets worse, not better. °MAKE SURE YOU: °· Understand these instructions. °· Will watch your condition. °· Will get help right away if you are not doing well or get worse. °Document Released: 04/08/2010 Document Revised: 05/29/2011 Document Reviewed: 11/07/2010 °[employer]® Patient Information ©2015 [employer]. This information is not intended to replace advice given to you by your health care provider. Make sure you discuss any questions you have with your health care provider. ° °

## 2014-01-03 NOTE — ED Provider Notes (Signed)
CSN: [id number]     Arrival date & time 01/01/14  1711 History   First MD Initiated Contact with Patient 01/01/14 1802     Chief Complaint  Patient presents with  . Finger Injury     (Consider location/radiation/quality/duration/timing/severity/associated sxs/prior Treatment) HPI  Jeremy Nichols is a 33 y.o. male who presents to the Emergency Department complaining of pain and swelling of the right thumb.  He states the pain began after injuring his thumb while changing a tire.  He states the pain is mainly to the distal joint of the thumb and worse with movement.  He states he has tried OTC medications without relief.  He denies numbness or pain to the hand or wrist.     Past Medical History  Diagnosis Date  . Peptic ulcer   . Hypertension   . Pain, dental   . Kidney stones   . Kidney stones   . Kidney stones    History reviewed. No pertinent past surgical history. History reviewed. No pertinent family history. History  Substance Use Topics  . Smoking status: Current Every Day Smoker -- 1.00 packs/day    Types: Cigarettes  . Smokeless tobacco: Not on file  . Alcohol Use: Yes     Comment: occasional    Review of Systems  Constitutional: Negative for fever and chills.  Genitourinary: Negative for dysuria and difficulty urinating.  Musculoskeletal: Positive for arthralgias and joint swelling.  Skin: Negative for color change and wound.  All other systems reviewed and are negative.     Allergies  Tramadol hcl  Home Medications   Prior to Admission medications   Medication Sig Start Date End Date Taking? Authorizing Provider  aspirin 325 MG tablet Take 325 mg by mouth daily as needed (pain).   Yes Historical Provider, MD  ranitidine (ZANTAC) 75 MG tablet Take 75 mg by mouth 2 (two) times daily.   Yes Historical Provider, MD  HYDROcodone-acetaminophen (NORCO/VICODIN) 5-325 MG per tablet Take one-two tabs po q 4-6 hrs prn pain 01/01/14   Arieliz Latino L. Koden Hunzeker, PA-C   naproxen (NAPROSYN) 500 MG tablet Take 1 tablet (500 mg total) by mouth 2 (two) times daily. Take with food 01/01/14   Giang Hemme L. Cashel Bellina, PA-C   BP 155/98  Pulse 90  Temp(Src) 98.1 F (36.7 C) (Oral)  Resp 14  SpO2 99% Physical Exam  Nursing note and vitals reviewed. Constitutional: He is oriented to person, place, and time. He appears well-developed and well-nourished. No distress.  HENT:  Head: Normocephalic and atraumatic.  Cardiovascular: Normal rate, regular rhythm and normal heart sounds.   No murmur heard. Pulmonary/Chest: Effort normal and breath sounds normal. No respiratory distress.  Musculoskeletal: He exhibits edema and tenderness.  ttp of the distal right thumb.  Mild STS.  Radial pulse is brisk, distal sensation intact.  CR< 2 sec.  No bruising or bony deformity.   No proximal tenderness or edema.  Neurological: He is alert and oriented to person, place, and time. He exhibits normal muscle tone. Coordination normal.  Skin: Skin is warm and dry.    ED Course  Procedures (including critical care time) Labs Review Labs Reviewed - No data to display  Imaging Review No results found.   EKG Interpretation None      MDM   Final diagnoses:  Sprain, thumb, right, initial encounter    velcro thumb spica applied, pain improved, remains NV intact.  Pt agrees to ice , elevate and ortho referral. Rx for vicodin and  naprosyn     Chance Karam L. Trisha Mangleriplett, PA-C 01/03/14 2010

## 2014-01-04 NOTE — ED Provider Notes (Signed)
Medical screening examination/treatment/procedure(s) were performed by non-physician practitioner and as supervising physician I was immediately available for consultation/collaboration.   EKG Interpretation None        Oluwatobi Visser L Areebah Meinders, MD 01/04/14 0022 

## 2014-01-21 ENCOUNTER — Encounter (HOSPITAL_COMMUNITY): Payer: Self-pay | Admitting: Emergency Medicine

## 2014-01-21 ENCOUNTER — Emergency Department (HOSPITAL_COMMUNITY)
Admission: EM | Admit: 2014-01-21 | Discharge: 2014-01-21 | Disposition: A | Discharge status: Home / Self Care | Payer: Self-pay | Attending: Emergency Medicine | Admitting: Emergency Medicine

## 2014-01-21 DIAGNOSIS — Z87448 Personal history of other diseases of urinary system: Secondary | ICD-10-CM | POA: Insufficient documentation

## 2014-01-21 DIAGNOSIS — Z792 Long term (current) use of antibiotics: Secondary | ICD-10-CM | POA: Insufficient documentation

## 2014-01-21 DIAGNOSIS — Z791 Long term (current) use of non-steroidal anti-inflammatories (NSAID): Secondary | ICD-10-CM | POA: Insufficient documentation

## 2014-01-21 DIAGNOSIS — Z79899 Other long term (current) drug therapy: Secondary | ICD-10-CM | POA: Insufficient documentation

## 2014-01-21 DIAGNOSIS — Z720 Tobacco use: Secondary | ICD-10-CM | POA: Insufficient documentation

## 2014-01-21 DIAGNOSIS — Z8719 Personal history of other diseases of the digestive system: Secondary | ICD-10-CM | POA: Insufficient documentation

## 2014-01-21 DIAGNOSIS — K088 Other specified disorders of teeth and supporting structures: Secondary | ICD-10-CM | POA: Insufficient documentation

## 2014-01-21 DIAGNOSIS — K0889 Other specified disorders of teeth and supporting structures: Secondary | ICD-10-CM

## 2014-01-21 DIAGNOSIS — I10 Essential (primary) hypertension: Secondary | ICD-10-CM | POA: Insufficient documentation

## 2014-01-21 MED ORDER — PENICILLIN V POTASSIUM 500 MG PO TABS: 500.0000 mg | ORAL_TABLET | Freq: Four times a day (QID) | ORAL | Status: AC

## 2014-01-21 MED ORDER — HYDROCODONE-ACETAMINOPHEN 5-325 MG PO TABS
1.0000 | ORAL_TABLET | Freq: Once | ORAL | Status: AC
  Administered 2014-01-21: 1 via ORAL
  Filled 2014-01-21: qty 1

## 2014-01-21 MED ORDER — HYDROCODONE-ACETAMINOPHEN 5-325 MG PO TABS: 1.0000 | ORAL_TABLET | Freq: Four times a day (QID) | ORAL | Status: DC | PRN

## 2014-01-21 NOTE — ED Notes (Signed)
Pt with dental pain for 4 days, states food got into the hole of tooth, per pt food has been removed but with pain esp with cold air

## 2014-01-21 NOTE — ED Provider Notes (Signed)
CSN: [id number]     Arrival date & time 01/21/14  1220 History  This chart was scribed for Benny LennertJoseph L Damilola Flamm, MD by Littie Deedsichard Sun, ED Scribe. This patient was seen in room APA09/APA09 and the patient's care was started at 1:31 PM.     Chief Complaint  Patient presents with  . Dental Pain     Patient is a 33 y.o. male presenting with tooth pain. The history is provided by the patient. No language interpreter was used.  Dental Pain Location:  Lower Duration:  2 days Timing:  Constant Progression:  Unchanged Chronicity:  New Associated symptoms: no headaches   Risk factors: lack of dental care    HPI Comments: Jeremy Nichols is a 33 y.o. male who presents to the Emergency Department complaining of constant, lower right-sided dental pain that began 2 days ago. Patient states ate something and it went into the hole of his tooth; it has been removed but he still has pain. He does not have a dentist currently. He has allergies to Tramadol.  Past Medical History  Diagnosis Date  . Peptic ulcer   . Hypertension   . Pain, dental   . Kidney stones   . Kidney stones   . Kidney stones    History reviewed. No pertinent past surgical history. History reviewed. No pertinent family history. History  Substance Use Topics  . Smoking status: Current Every Day Smoker -- 1.00 packs/day    Types: Cigarettes  . Smokeless tobacco: Not on file  . Alcohol Use: Yes     Comment: occasional    Review of Systems  Constitutional: Negative for appetite change and fatigue.  HENT: Positive for dental problem.   Eyes: Negative for discharge.  Respiratory: Negative for cough.   Cardiovascular: Negative for chest pain.  Gastrointestinal: Negative for abdominal pain and diarrhea.  Genitourinary: Negative for frequency and hematuria.  Musculoskeletal: Negative for back pain.  Skin: Negative for rash.  Neurological: Negative for seizures and headaches.  Psychiatric/Behavioral: Negative for hallucinations.       Allergies  Tramadol hcl  Home Medications   Prior to Admission medications   Medication Sig Start Date End Date Taking? Authorizing Provider  aspirin 325 MG tablet Take 325 mg by mouth daily as needed (pain).    Historical Provider, MD  HYDROcodone-acetaminophen (NORCO/VICODIN) 5-325 MG per tablet Take one-two tabs po q 4-6 hrs prn pain 01/01/14   Tammy L. Triplett, PA-C  naproxen (NAPROSYN) 500 MG tablet Take 1 tablet (500 mg total) by mouth 2 (two) times daily. Take with food 01/01/14   Tammy L. Triplett, PA-C  ranitidine (ZANTAC) 75 MG tablet Take 75 mg by mouth 2 (two) times daily.    Historical Provider, MD   BP 154/97 mmHg  Pulse 74  Temp(Src) 97.5 F (36.4 C) (Oral)  Resp 18  Ht 6' (1.829 m)  Wt 200 lb (90.719 kg)  BMI 27.12 kg/m2  SpO2 99% Physical Exam  Constitutional: He is oriented to person, place, and time. He appears well-developed.  HENT:  Head: Normocephalic.  Poor dentition lower right and upper right teeth. Tenderness to lower right molar.  Eyes: Conjunctivae are normal.  Neck: No tracheal deviation present.  Cardiovascular:  No murmur heard. Musculoskeletal: Normal range of motion.  Neurological: He is oriented to person, place, and time.  Skin: Skin is warm.  Psychiatric: He has a normal mood and affect.  Nursing note and vitals reviewed.   ED Course  Procedures  DIAGNOSTIC STUDIES:  Oxygen Saturation is 99% on room air, normal by my interpretation.    COORDINATION OF CARE: 1:32 PM-Discussed treatment plan which includes pain medication and abx with pt at bedside and pt agreed to plan.    Labs Review Labs Reviewed - No data to display  Imaging Review No results found.   EKG Interpretation None      MDM   Final diagnoses:  None    The chart was scribed for me under my direct supervision.  I personally performed the history, physical, and medical decision making and all procedures in the evaluation of this  patient.Benny Lennert.      Canon Gola L Josel Keo, MD 01/21/14 1435

## 2014-01-21 NOTE — ED Notes (Signed)
Pt verbalized understanding to use caution and no driving within 4 hours of taking pain meds due to meds cause drowsiness  

## 2014-01-21 NOTE — Discharge Instructions (Signed)
Follow up with a dentist in one week.

## 2014-01-21 NOTE — ED Notes (Signed)
Pt reports dental pain x4 days. Pt denies any known fevers. Airway patent.

## 2014-04-22 ENCOUNTER — Encounter (HOSPITAL_COMMUNITY): Payer: Self-pay | Admitting: Emergency Medicine

## 2014-04-22 ENCOUNTER — Emergency Department (HOSPITAL_COMMUNITY): Payer: Self-pay

## 2014-04-22 ENCOUNTER — Emergency Department (HOSPITAL_COMMUNITY)
Admission: EM | Admit: 2014-04-22 | Discharge: 2014-04-22 | Disposition: A | Discharge status: Home / Self Care | Payer: Self-pay | Attending: Emergency Medicine | Admitting: Emergency Medicine

## 2014-04-22 DIAGNOSIS — Z720 Tobacco use: Secondary | ICD-10-CM | POA: Insufficient documentation

## 2014-04-22 DIAGNOSIS — Z79899 Other long term (current) drug therapy: Secondary | ICD-10-CM | POA: Insufficient documentation

## 2014-04-22 DIAGNOSIS — Z87442 Personal history of urinary calculi: Secondary | ICD-10-CM | POA: Insufficient documentation

## 2014-04-22 DIAGNOSIS — R079 Chest pain, unspecified: Secondary | ICD-10-CM | POA: Insufficient documentation

## 2014-04-22 DIAGNOSIS — Z8711 Personal history of peptic ulcer disease: Secondary | ICD-10-CM | POA: Insufficient documentation

## 2014-04-22 DIAGNOSIS — Z8719 Personal history of other diseases of the digestive system: Secondary | ICD-10-CM | POA: Insufficient documentation

## 2014-04-22 DIAGNOSIS — I10 Essential (primary) hypertension: Secondary | ICD-10-CM | POA: Insufficient documentation

## 2014-04-22 LAB — BASIC METABOLIC PANEL
Anion gap: 7 (ref 5–15)
BUN: 8 mg/dL (ref 6–23)
CO2: 26 mmol/L (ref 19–32)
CREATININE: 1.02 mg/dL (ref 0.50–1.35)
Calcium: 9.2 mg/dL (ref 8.4–10.5)
Chloride: 103 mmol/L (ref 96–112)
GFR calc Af Amer: >90 mL/min (ref >90)
GFR calc non Af Amer: >90 mL/min (ref >90)
Glucose, Bld: 93 mg/dL (ref 70–99)
Potassium: 3.8 mmol/L (ref 3.5–5.1)
SODIUM: 136 mmol/L (ref 135–145)

## 2014-04-22 LAB — CBC WITH DIFFERENTIAL/PLATELET
BASOS ABS: 0 10*3/uL (ref 0.0–0.1)
BASOS PCT: 0 % (ref 0–1)
EOS ABS: 0.1 10*3/uL (ref 0.0–0.7)
EOS PCT: 1 % (ref 0–5)
HEMATOCRIT: 47 % (ref 39.0–52.0)
HEMOGLOBIN: 16.7 g/dL (ref 13.0–17.0)
LYMPHS ABS: 3.9 10*3/uL (ref 0.7–4.0)
LYMPHS PCT: 38 % (ref 12–46)
MCH: 34.4 pg — ABNORMAL HIGH (ref 26.0–34.0)
MCHC: 35.5 g/dL (ref 30.0–36.0)
MCV: 96.9 fL (ref 78.0–100.0)
MONO ABS: 0.8 10*3/uL (ref 0.1–1.0)
Monocytes Relative: 8 % (ref 3–12)
NEUTROS ABS: 5.3 10*3/uL (ref 1.7–7.7)
Neutrophils Relative %: 53 % (ref 43–77)
PLATELETS: 278 10*3/uL (ref 150–400)
RBC: 4.85 MIL/uL (ref 4.22–5.81)
RDW: 12.3 % (ref 11.5–15.5)
WBC: 10.2 10*3/uL (ref 4.0–10.5)

## 2014-04-22 LAB — TROPONIN I

## 2014-04-22 MED ORDER — NAPROXEN 250 MG PO TABS
500.0000 mg | ORAL_TABLET | Freq: Once | ORAL | Status: AC
  Administered 2014-04-22: 500 mg via ORAL
  Filled 2014-04-22: qty 2

## 2014-04-22 MED ORDER — NAPROXEN 500 MG PO TABS
500.0000 mg | ORAL_TABLET | Freq: Two times a day (BID) | ORAL | Status: DC
Start: 2014-04-22 — End: 2014-12-17

## 2014-04-22 MED ORDER — PREDNISONE 50 MG PO TABS
60.0000 mg | ORAL_TABLET | Freq: Once | ORAL | Status: AC
  Administered 2014-04-22: 60 mg via ORAL
  Filled 2014-04-22 (×2): qty 1

## 2014-04-22 MED ORDER — PREDNISONE 50 MG PO TABS: ORAL_TABLET | ORAL | Status: DC

## 2014-04-22 MED ORDER — KETOROLAC TROMETHAMINE 30 MG/ML IJ SOLN
30.0000 mg | Freq: Once | INTRAMUSCULAR | Status: AC
  Administered 2014-04-22: 30 mg via INTRAVENOUS
  Filled 2014-04-22: qty 1

## 2014-04-22 NOTE — Discharge Instructions (Signed)
Tests were good. Stop smoking. Prescription for pain and inflammation. You'll need a primary care physician.

## 2014-04-22 NOTE — ED Notes (Signed)
Patient complaining of left sided chest pain x 1 week. Also complaining of cough and states pain is worse with cough.

## 2014-04-23 NOTE — ED Provider Notes (Signed)
CSN: [id number]     Arrival date & time 04/22/14  1710 History   First MD Initiated Contact with Patient 04/22/14 2036     Chief Complaint  Patient presents with  . Chest Pain     (Consider location/radiation/quality/duration/timing/severity/associated sxs/prior Treatment) HPI.... Vague left-sided chest pain for several days, intermittently, without any associated features. No radiation. Pain is worse with deep breaths, positioning, palpation. He has a history of hypertension, but is untreated. Cigarette smoker. Father allegedly had heart disease. 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 Peptic ulcer   . Hypertension   . Pain, dental   . Kidney stones   . Kidney stones   . Kidney stones    History reviewed. No pertinent past surgical history. History reviewed. No pertinent family history. History  Substance Use Topics  . Smoking status: Current Every Day Smoker -- 1.00 packs/day    Types: Cigarettes  . Smokeless tobacco: Not on file  . Alcohol Use: Yes     Comment: occasional    Review of Systems  All other systems reviewed and are negative.     Allergies  Tramadol hcl  Home Medications   Prior to Admission medications   Medication Sig Start Date End Date Taking? Authorizing Provider  Pseudoeph-Doxylamine-DM-APAP (NYQUIL PO) Take 30 mLs by mouth every 4 (four) hours as needed (for cold and cough).   Yes Historical Provider, MD  ranitidine (ZANTAC) 75 MG tablet Take 75 mg by mouth daily as needed for heartburn.    Yes Historical Provider, MD  HYDROcodone-acetaminophen (NORCO/VICODIN) 5-325 MG per tablet Take 1 tablet by mouth every 6 (six) hours as needed. Patient not taking: Reported on 04/22/2014 01/21/14   Benny LennertJoseph L Zammit, MD  naproxen (NAPROSYN) 500 MG tablet Take 1 tablet (500 mg total) by mouth 2 (two) times daily. 04/22/14   Donnetta HutchingBrian Asuncion Shibata, MD  predniSONE (DELTASONE) 50 MG tablet 1 tablet for 6 days, one half tablet for 6 days 04/22/14   Donnetta HutchingBrian  Day Greb, MD   BP 154/101 mmHg  Pulse 96  Temp(Src) 99.2 F (37.3 C) (Oral)  Resp 19  Ht 6' (1.829 m)  Wt 200 lb (90.719 kg)  BMI 27.12 kg/m2  SpO2 94% Physical Exam  Constitutional: He is oriented to person, place, and time. He appears well-developed and well-nourished.  HENT:  Head: Normocephalic and atraumatic.  Eyes: Conjunctivae and EOM are normal. Pupils are equal, round, and reactive to light.  Neck: Normal range of motion. Neck supple.  Cardiovascular: Normal rate and regular rhythm.   Pulmonary/Chest: Effort normal and breath sounds normal.  Abdominal: Soft. Bowel sounds are normal.  Musculoskeletal: Normal range of motion.  Neurological: He is alert and oriented to person, place, and time.  Skin: Skin is warm and dry.  Psychiatric: He has a normal mood and affect. His behavior is normal.  Nursing note and vitals reviewed.   ED Course  Procedures (including critical care time) Labs Review Labs Reviewed  CBC WITH DIFFERENTIAL/PLATELET - Abnormal; Notable for the following:    MCH 34.4 (*)    All other components within normal limits  BASIC METABOLIC PANEL  TROPONIN I    Imaging Review Dg Chest 2 View  04/22/2014   CLINICAL DATA:  Mid to right-sided chest pain for 1 week.  EXAM: CHEST  2 VIEW  COMPARISON:  None.  FINDINGS: Normal heart size and mediastinal contours. No acute infiltrate or edema. No effusion or pneumothorax. No acute osseous  findings.  IMPRESSION: Negative chest.   Electronically Signed   By: Tiburcio Pea M.D.   On: 04/22/2014 22:28     EKG Interpretation   Date/Time:  Wednesday April 22 2014 18:09:25 EST Ventricular Rate:  93 PR Interval:  162 QRS Duration: 100 QT Interval:  344 QTC Calculation: 427 R Axis:   84 Text Interpretation:  Normal sinus rhythm Possible Left atrial enlargement  Borderline ECG Confirmed by Neely Cecena  MD, Timm Bonenberger ([id number]) on 04/22/2014 6:15:50 PM      MDM   Final diagnoses:  Chest pain, unspecified chest pain type     Pain is clearly worse with deep breath and positioning. Screening EKG, chest x-ray, troponin all negative. Discussed cardiac risk factors with patient including hypertension, smoking, family history. He will follow-up with his primary care doctor. Discharge medications prednisone and Naprosyn 500 mg    Donnetta Hutching, MD 04/23/14 1140

## 2014-05-12 ENCOUNTER — Emergency Department (HOSPITAL_COMMUNITY): Payer: Self-pay

## 2014-05-12 ENCOUNTER — Encounter (HOSPITAL_COMMUNITY): Payer: Self-pay | Admitting: *Deleted

## 2014-05-12 ENCOUNTER — Emergency Department (HOSPITAL_COMMUNITY)
Admission: EM | Admit: 2014-05-12 | Discharge: 2014-05-12 | Disposition: A | Discharge status: Home / Self Care | Payer: Self-pay | Attending: Emergency Medicine | Admitting: Emergency Medicine

## 2014-05-12 DIAGNOSIS — I10 Essential (primary) hypertension: Secondary | ICD-10-CM | POA: Insufficient documentation

## 2014-05-12 DIAGNOSIS — R1031 Right lower quadrant pain: Secondary | ICD-10-CM | POA: Insufficient documentation

## 2014-05-12 DIAGNOSIS — R110 Nausea: Secondary | ICD-10-CM | POA: Insufficient documentation

## 2014-05-12 DIAGNOSIS — Z87442 Personal history of urinary calculi: Secondary | ICD-10-CM | POA: Insufficient documentation

## 2014-05-12 DIAGNOSIS — Z8719 Personal history of other diseases of the digestive system: Secondary | ICD-10-CM | POA: Insufficient documentation

## 2014-05-12 DIAGNOSIS — Z8711 Personal history of peptic ulcer disease: Secondary | ICD-10-CM | POA: Insufficient documentation

## 2014-05-12 DIAGNOSIS — M549 Dorsalgia, unspecified: Secondary | ICD-10-CM | POA: Insufficient documentation

## 2014-05-12 DIAGNOSIS — Z720 Tobacco use: Secondary | ICD-10-CM | POA: Insufficient documentation

## 2014-05-12 DIAGNOSIS — R109 Unspecified abdominal pain: Secondary | ICD-10-CM

## 2014-05-12 LAB — BASIC METABOLIC PANEL
Anion gap: 4 — ABNORMAL LOW (ref 5–15)
BUN: 11 mg/dL (ref 6–23)
CHLORIDE: 101 mmol/L (ref 96–112)
CO2: 27 mmol/L (ref 19–32)
CREATININE: 1.06 mg/dL (ref 0.50–1.35)
Calcium: 9.1 mg/dL (ref 8.4–10.5)
GFR calc non Af Amer: 89 mL/min — ABNORMAL LOW (ref >90)
Glucose, Bld: 87 mg/dL (ref 70–99)
POTASSIUM: 4.1 mmol/L (ref 3.5–5.1)
Sodium: 132 mmol/L — ABNORMAL LOW (ref 135–145)

## 2014-05-12 LAB — CBC WITH DIFFERENTIAL/PLATELET
BASOS ABS: 0 10*3/uL (ref 0.0–0.1)
BASOS PCT: 0 % (ref 0–1)
EOS PCT: 1 % (ref 0–5)
Eosinophils Absolute: 0.1 10*3/uL (ref 0.0–0.7)
HEMATOCRIT: 48.4 % (ref 39.0–52.0)
Hemoglobin: 16.9 g/dL (ref 13.0–17.0)
LYMPHS PCT: 40 % (ref 12–46)
Lymphs Abs: 3.3 10*3/uL (ref 0.7–4.0)
MCH: 33.6 pg (ref 26.0–34.0)
MCHC: 34.9 g/dL (ref 30.0–36.0)
MCV: 96.2 fL (ref 78.0–100.0)
MONO ABS: 0.8 10*3/uL (ref 0.1–1.0)
Monocytes Relative: 9 % (ref 3–12)
Neutro Abs: 4.1 10*3/uL (ref 1.7–7.7)
Neutrophils Relative %: 50 % (ref 43–77)
Platelets: 284 10*3/uL (ref 150–400)
RBC: 5.03 MIL/uL (ref 4.22–5.81)
RDW: 11.6 % (ref 11.5–15.5)
WBC: 8.3 10*3/uL (ref 4.0–10.5)

## 2014-05-12 LAB — URINALYSIS, ROUTINE W REFLEX MICROSCOPIC
Bilirubin Urine: NEGATIVE
Glucose, UA: NEGATIVE mg/dL
Hgb urine dipstick: NEGATIVE
Ketones, ur: NEGATIVE mg/dL
LEUKOCYTES UA: NEGATIVE
NITRITE: NEGATIVE
PROTEIN: NEGATIVE mg/dL
SPECIFIC GRAVITY, URINE: 1.025 (ref 1.005–1.030)
UROBILINOGEN UA: 0.2 mg/dL (ref 0.0–1.0)
pH: 6 (ref 5.0–8.0)

## 2014-05-12 MED ORDER — SODIUM CHLORIDE 0.9 % IV SOLN
INTRAVENOUS | Status: DC
  Administered 2014-05-12: 20:00:00 via INTRAVENOUS

## 2014-05-12 MED ORDER — HYDROMORPHONE HCL 1 MG/ML IJ SOLN
1.0000 mg | Freq: Once | INTRAMUSCULAR | Status: AC
  Administered 2014-05-12: 1 mg via INTRAVENOUS
  Filled 2014-05-12: qty 1

## 2014-05-12 MED ORDER — ONDANSETRON HCL 4 MG/2ML IJ SOLN
4.0000 mg | Freq: Once | INTRAMUSCULAR | Status: AC
  Administered 2014-05-12: 4 mg via INTRAVENOUS
  Filled 2014-05-12: qty 2

## 2014-05-12 MED ORDER — SODIUM CHLORIDE 0.9 % IV BOLUS (SEPSIS)
250.0000 mL | Freq: Once | INTRAVENOUS | Status: AC
  Administered 2014-05-12: 250 mL via INTRAVENOUS

## 2014-05-12 MED ORDER — NAPROXEN 500 MG PO TABS: 500.0000 mg | ORAL_TABLET | Freq: Two times a day (BID) | ORAL | Status: DC

## 2014-05-12 MED ORDER — HYDROCODONE-ACETAMINOPHEN 5-325 MG PO TABS: 1.0000 | ORAL_TABLET | Freq: Four times a day (QID) | ORAL | Status: DC | PRN

## 2014-05-12 NOTE — ED Notes (Signed)
Pain RLQ , started in back  Hx of kidney stones, nausea last night.  Alert,

## 2014-05-12 NOTE — ED Provider Notes (Addendum)
CSN: [id number]     Arrival date & time 05/12/14  1727 History   First MD Initiated Contact with Patient 05/12/14 1918     Chief Complaint  Patient presents with  . Abdominal Pain     (Consider location/radiation/quality/duration/timing/severity/associated sxs/prior Treatment) Patient is a 34 y.o. male presenting with abdominal pain. The history is provided by the patient.  Abdominal Pain Associated symptoms: hematuria and nausea   Associated symptoms: no chest pain, no fever, no shortness of breath and no vomiting    patient with onset of back pain on Sunday moved to right flank and then to right lower quadrant at 3 in the morning. Associated with nausea but no vomiting associated hematuria but no fever. Pain currently 8 out of 10. Its worse it was 10 out of 10. Patient has a history of kidney stones in the past. Pain is similar to that. No injury. No fevers.  Past Medical History  Diagnosis Date  . Peptic ulcer   . Hypertension   . Pain, dental   . Kidney stones   . Kidney stones   . Kidney stones    History reviewed. No pertinent past surgical history. History reviewed. No pertinent family history. History  Substance Use Topics  . Smoking status: Current Every Day Smoker -- 1.00 packs/day    Types: Cigarettes  . Smokeless tobacco: Not on file  . Alcohol Use: Yes     Comment: occasional    Review of Systems  Constitutional: Negative for fever.  HENT: Negative for congestion.   Eyes: Negative for visual disturbance.  Respiratory: Negative for shortness of breath.   Cardiovascular: Negative for chest pain.  Gastrointestinal: Positive for nausea and abdominal pain. Negative for vomiting.  Genitourinary: Positive for hematuria and flank pain.  Musculoskeletal: Positive for back pain.  Skin: Negative for rash.  Neurological: Negative for headaches.  Hematological: Does not bruise/bleed easily.  Psychiatric/Behavioral: Negative for confusion.      Allergies  Tramadol  hcl  Home Medications   Prior to Admission medications   Medication Sig Start Date End Date Taking? Authorizing Provider  HYDROcodone-acetaminophen (NORCO/VICODIN) 5-325 MG per tablet Take 1 tablet by mouth every 6 (six) hours as needed. Patient not taking: Reported on 04/22/2014 01/21/14   Benny Lennert, MD  HYDROcodone-acetaminophen (NORCO/VICODIN) 5-325 MG per tablet Take 1-2 tablets by mouth every 6 (six) hours as needed. 05/12/14   Vanetta Mulders, MD  naproxen (NAPROSYN) 500 MG tablet Take 1 tablet (500 mg total) by mouth 2 (two) times daily. 04/22/14   Donnetta Hutching, MD  naproxen (NAPROSYN) 500 MG tablet Take 1 tablet (500 mg total) by mouth 2 (two) times daily. 05/12/14   Vanetta Mulders, MD  predniSONE (DELTASONE) 50 MG tablet 1 tablet for 6 days, one half tablet for 6 days 04/22/14   Donnetta Hutching, MD  Pseudoeph-Doxylamine-DM-APAP (NYQUIL PO) Take 30 mLs by mouth every 4 (four) hours as needed (for cold and cough).    Historical Provider, MD  ranitidine (ZANTAC) 75 MG tablet Take 75 mg by mouth daily as needed for heartburn.     Historical Provider, MD   BP 138/98 mmHg  Pulse 91  Temp(Src) 98.1 F (36.7 C) (Oral)  Resp 18  Ht 6' (1.829 m)  Wt 200 lb (90.719 kg)  BMI 27.12 kg/m2  SpO2 96% Physical Exam  Constitutional: He is oriented to person, place, and time. He appears well-developed and well-nourished. No distress.  HENT:  Head: Normocephalic and atraumatic.  Mouth/Throat: Oropharynx is  clear and moist.  Eyes: Conjunctivae and EOM are normal. Pupils are equal, round, and reactive to light.  Neck: Normal range of motion.  Cardiovascular: Normal rate, regular rhythm and normal heart sounds.   Pulmonary/Chest: Effort normal and breath sounds normal.  Abdominal: Soft. Bowel sounds are normal. There is no tenderness.  Musculoskeletal: Normal range of motion.  Neurological: He is alert and oriented to person, place, and time. No cranial nerve deficit. He exhibits normal muscle tone.  Coordination normal.  Skin: Skin is warm. No rash noted.  Nursing note and vitals reviewed.   ED Course  Procedures (including critical care time) Labs Review Labs Reviewed  BASIC METABOLIC PANEL - Abnormal; Notable for the following:    Sodium 132 (*)    GFR calc non Af Amer 89 (*)    Anion gap 4 (*)    All other components within normal limits  CBC WITH DIFFERENTIAL/PLATELET  URINALYSIS, ROUTINE W REFLEX MICROSCOPIC   Results for orders placed or performed during the hospital encounter of 05/12/14  Basic metabolic panel  Result Value Ref Range   Sodium 132 (L) 135 - 145 mmol/L   Potassium 4.1 3.5 - 5.1 mmol/L   Chloride 101 96 - 112 mmol/L   CO2 27 19 - 32 mmol/L   Glucose, Bld 87 70 - 99 mg/dL   BUN 11 6 - 23 mg/dL   Creatinine, Ser 1.61 0.50 - 1.35 mg/dL   Calcium 9.1 8.4 - 09.6 mg/dL   GFR calc non Af Amer 89 (L) >90 mL/min   GFR calc Af Amer >90 >90 mL/min   Anion gap 4 (L) 5 - 15  CBC with Differential  Result Value Ref Range   WBC 8.3 4.0 - 10.5 K/uL   RBC 5.03 4.22 - 5.81 MIL/uL   Hemoglobin 16.9 13.0 - 17.0 g/dL   HCT 04.5 40.9 - 81.1 %   MCV 96.2 78.0 - 100.0 fL   MCH 33.6 26.0 - 34.0 pg   MCHC 34.9 30.0 - 36.0 g/dL   RDW 91.4 78.2 - 95.6 %   Platelets 284 150 - 400 K/uL   Neutrophils Relative % 50 43 - 77 %   Neutro Abs 4.1 1.7 - 7.7 K/uL   Lymphocytes Relative 40 12 - 46 %   Lymphs Abs 3.3 0.7 - 4.0 K/uL   Monocytes Relative 9 3 - 12 %   Monocytes Absolute 0.8 0.1 - 1.0 K/uL   Eosinophils Relative 1 0 - 5 %   Eosinophils Absolute 0.1 0.0 - 0.7 K/uL   Basophils Relative 0 0 - 1 %   Basophils Absolute 0.0 0.0 - 0.1 K/uL  Urinalysis, Routine w reflex microscopic  Result Value Ref Range   Color, Urine YELLOW YELLOW   APPearance CLEAR CLEAR   Specific Gravity, Urine 1.025 1.005 - 1.030   pH 6.0 5.0 - 8.0   Glucose, UA NEGATIVE NEGATIVE mg/dL   Hgb urine dipstick NEGATIVE NEGATIVE   Bilirubin Urine NEGATIVE NEGATIVE   Ketones, ur NEGATIVE NEGATIVE  mg/dL   Protein, ur NEGATIVE NEGATIVE mg/dL   Urobilinogen, UA 0.2 0.0 - 1.0 mg/dL   Nitrite NEGATIVE NEGATIVE   Leukocytes, UA NEGATIVE NEGATIVE     Imaging Review Ct Renal Stone Study  05/12/2014   CLINICAL DATA:  Right flank pain.  History kidney stones  EXAM: CT ABDOMEN AND PELVIS WITHOUT CONTRAST  TECHNIQUE: Multidetector CT imaging of the abdomen and pelvis was performed following the standard protocol without IV contrast.  COMPARISON:  CT 08/06/2012  FINDINGS: Lung bases are clear.  Heart size normal.  Liver and spleen are normal. Pancreas normal. Small calcified gallstone measuring 2 mm. Gallbladder wall not thickened. No biliary ductal dilatation.  Bilateral renal calculi. Largest stone on the right measures 5 mm. Largest stone on the left measures 3 mm. No renal obstruction. No ureteral or bladder calculi.  Negative for bowel obstruction or bowel thickening.  No free fluid.  No focal bony lesion.  IMPRESSION: Bilateral renal calculi without renal obstruction. No acute abnormality.   Electronically Signed   By: Marlan Palauharles  Clark M.D.   On: 05/12/2014 21:12     EKG Interpretation None      MDM   Final diagnoses:  Flank pain    Workup for the right-sided flank pain is negative no evidence of any ureteral stones. No evidence of hematuria or urinary tract infection. Electrolytes are normal including kidney function. No leukocytosis. Check cause of the pain is not clear. Patient we treated with pain medicine anti-inflammatory medicine. Resource guide provided for follow-up. There are some remaining stones up in the kidney but no stones in the ureter. The stone should be asymptomatic.  CT scan did not visualize the appendix. Think it's unlikely that it is appendicitis. But there is subjective pain in the right lower quadrant area that started at 3 in the morning no fevers no white count elevation symptoms did begin on Sunday 3 days ago site think it's unlikely. The patient is aware that is  not completely ruled out and will return for any new or worse symptoms.  Vanetta MuldersScott Teondra Newburg, MD 05/12/14 [id number]  Vanetta MuldersScott Shailey Butterbaugh, MD 05/12/14 2151

## 2014-05-12 NOTE — ED Notes (Signed)
Patient states that pain has returned after using restroom. States that pain medication has worn off. Asking if he can get something else for pain at this time. Will check with nurse.

## 2014-05-12 NOTE — Discharge Instructions (Signed)
Workup for the area flank pain without any evidence of any kidney stones. Urinalysis is normal. Take pain medicine as directed. Take the Naprosyn as directed. Return for any new or worse symptoms. Resource guide provided below to help you find a regular doctor.   Emergency Department Resource Guide 1) Find a Doctor and Pay Out of Pocket Although you won't have to find out who is covered by your insurance plan, it is a good idea to ask around and get recommendations. You will then need to call the office and see if the doctor you have chosen will accept you as a new patient and what types of options they offer for patients who are self-pay. Some doctors offer discounts or will set up payment plans for their patients who do not have insurance, but you will need to ask so you aren't surprised when you get to your appointment.  2) Contact Your Local Health Department Not all health departments have doctors that can see patients for sick visits, but many do, so it is worth a call to see if yours does. If you don't know where your local health department is, you can check in your phone book. The CDC also has a tool to help you locate your state's health department, and many state websites also have listings of all of their local health departments.  3) Find a Walk-in Clinic If your illness is not likely to be very severe or complicated, you may want to try a walk in clinic. These are popping up all over the country in pharmacies, drugstores, and shopping centers. They're usually staffed by nurse practitioners or physician assistants that have been trained to treat common illnesses and complaints. They're usually fairly quick and inexpensive. However, if you have serious medical issues or chronic medical problems, these are probably not your best option.  No Primary Care Doctor: - Call Health Connect at  [phone] - they can help you locate a primary care doctor that  accepts your insurance, provides certain  services, etc. - Physician Referral Service- [phone]  Chronic Pain Problems: Organization         Address  Phone   Notes  [facility]  [phone] Patients need to be referred by their primary care doctor.   Medication Assistance: Organization         Address  Phone   Notes  [facility] [address] [place], [place] [address] [phone] --Must be a resident of [facility] [place] -- Must have NO insurance coverage whatsoever (no Medicaid/ Medicare, etc.) -- The pt. MUST have a primary care doctor that directs their care regularly and follows them in the community   MedAssist  [phone]) [phone]   [employer]  [phone]) [phone]    Agencies that provide inexpensive medical care: Organization         Address  Phone   Notes  [facility]  [phone]   [facility]    [phone]   [facility] [address] [place], [place] [address] [phone]   [facility] [address], [place] [phone]   [facility]anned Parenthood    [phone]   [facility]    [phone]   [facility]  [address], [place] Phone:  [phone], Fax:  [phone] Hours of Operation:  9 am - 6 pm, M-F.  Also accepts Medicaid/Medicare and self-pay.  [facility] for [place] [place], [address], [place] Phone: [phone], Fax: [phone]. Hours of Operation:  8:30 am - 5:30 pm, M-F.  Also accepts Medicaid and self-pay.  [facility] [address] [place] Phone: [phone]   [place], [place], [place] [phone], Ext. [phone] Mondays & Thursdays: 7-9 AM.  First 15 patients are seen on a first come, first serve basis.    [place] Providers:  Organization         Address  Phone   Notes  [facility] [address], [place] [phone] Also accepts self-pay patients.  [facility] [address], [place]  [phone]   [place], [address], [place] [phone]   [facility] [address], [place] [phone]   Lucianne Lei [address], [place]   [phone] Only accepts [place] Access [place] patients after they have their name applied to their card.   Self-Pay (no insurance) in [facility] Xxx:  Organization         Address  Phone   Notes  Sickle Cell Patients, [facility] [place] [phone]   [facility] [place] [phone]   [facility] [place]  [place], [place], [place] [phone]   [facility]/Dr. Osei-Bonsu  [address] [place] or [address], [place] [phone] Phone number for both [place] and [place] locations is the same.  [facility]gent Medical and [facility] [address], [place] [phone]   [facility] [address] [place] or [address] [phone]   [facility] [address] [place] [phone], phone; [phone], fax Sees patients 1st and 3rd Saturday of every month.  Must not qualify for public or private insurance (i.e. Medicaid, Medicare, [place] Health Choice, Veterans' Benefits)  Household income should be no more than 200% of the poverty level The clinic cannot treat you if you are pregnant or think you are pregnant  Sexually transmitted diseases are not treated at the clinic.    Dental Care: Organization         Address  Phone  Notes  [facility] Department of [facility] [place] [phone] Accepts  children up to age 57 who are enrolled in [place] or [place]; pregnant women with a Medicaid card; and children who have applied for Medicaid or [place] Health Choice, but were declined, whose parents can pay a reduced fee at time of service.  [facility] Department of [facility]  [address], [place] [phone] Accepts children up to age 80 who are enrolled in [place] or [place]; pregnant women with a Medicaid card; and children who have applied for Medicaid or [place] Health Choice, but were declined, whose parents can pay a reduced fee at time of service.  [facility] PRO[facility]  [place] [phone] Patients are seen by appointment only. Walk-ins are not accepted. [place] will see patients 42 years of age and older. Monday - Tuesday (  8am-5pm) Most Wednesdays (8:30-5pm) $30 per visit, cash only  [facility] Adult [facility] Access PROGRAM  [address], [facility] [phone] Patients are seen by appointment only. Walk-ins are not accepted. [place] will see patients 78 years of age and older. One Wednesday Evening (Monthly: Volunteer Based).  $30 per visit, cash only  [place]  [phone] for adults; Children under age 55, call Graduate Pediatric Dentistry at [phone]. Children aged 17-14, please call [phone] to request a pediatric application.  Dental services are provided in all areas of dental care including fillings, crowns and bridges, complete and partial dentures, implants, gum treatment, root canals, and extractions. Preventive care is also provided. Treatment is provided to both adults and children. Patients are selected via a lottery and there is often a waiting list.   [facility] [address], [place]  [phone] [email or web address]   [facility] [address] [place], [place] [phone], Ext. [phone] Second and  Fourth Thursday of each month, opens at 6:30 AM; Clinic ends at 9 AM.  Patients are seen on a first-come first-served basis, and a limited number are seen during each clinic.   [facility]  [address] Hillard Danker [place], [place] [phone]   Eligibility Requirements You must have lived in [place], [place], or [place] counties for at least the last three months.   You cannot be eligible for state or federal sponsored [employer], including [employer], [place], or [employer].   You generally cannot be eligible for healthcare insurance through your employer.    How to apply: Eligibility screenings are held every Tuesday and Wednesday afternoon from 1:00 pm until 4:00 pm. You do not need an appointment for the interview!  [facility] [address] [place], [place]   Head of the Harbor  [place] Department  [place]  [phone]    Behavioral Health Resources in the Community: Intensive Outpatient Programs Organization         Address  Phone  Notes  [place]. [address] [place], [place] [phone]   [facility] [address], [place], [place]   ADS: Alcohol & Drug Svcs [address] [place], [place]   [place] [address],  [place], [place] or [phone]   Substance Abuse Resources Organization         Address  Phone  Notes  Alcohol and Drug Services  [phone]   [place]  [phone]   [facility] [place]   Chinita Pester  [phone]   Residential & Outpatient Substance Abuse Program  [phone]   Psychological Services Organization         Address  Phone  Notes  [facility] [place]  [place]  [phone]   [place]  [address], [place] or [phone]    Mobile Crisis Teams Organization         Address  Phone  Notes  [facility], Mobile Crisis Care Unit  [phone]   [facility] Psychotherapeutic Services  [address] [place], [place]   [place] Levels [address], [place] [phone]    Self-Help/Support Groups Organization         Address  Phone  Notes  Mental Health Assoc. of [place] - variety of support groups  [place] Call for more information  Narcotics Anonymous (NA), [facility] [address], [employer] [place]  2 meetings at this location   Special educational needs teacher         Address  Phone  Notes  [facility] Residential Treatment [place],    [place]  [phone]   [facility]  [address] [place] [phone], [place], [place]   [place], [place] [phone] Admissions: 8am-3pm M-F  Incentives Substance [place] [address]    [place], [place] [phone]   [facility] [address] [place], [place], [place]   [facility] [address],  [place], [place]   [facility] - Intensive Outpatient [address] Kristeen [address], [place], [place]   [facility] [address] [place]  [place], [place] [phone] or [phone]   [facility] (RTS) [address] [place], [place] Accepts Medicaid  [facility] [facility] [address],  [place] [phone] Substance Abuse/Addiction Treatment   [facility] Organization         Address  Phone  Notes  [facility]  [phone]   Domenic Schwab, PhD [address] Arlis Porta [place], [place]   [phone] or [phone]   [place], [place] [phone]   [facility] [address] [place], [place] [phone] Insurance/Medicaid/sponsorship through [facility] and Families [address], [address] [place]                                    [place], [place] [phone] [place] [address], [place] [phone]    Dr. Adele Schilder  [phone]   [facility] Dept. 1) [address] [place] 2) [place] 3)  [place] 65, [place] [phone]  [phone]   [place] [phone] or [phone] (After Hours)

## 2014-07-03 ENCOUNTER — Emergency Department (HOSPITAL_COMMUNITY): Payer: Self-pay

## 2014-07-03 ENCOUNTER — Emergency Department (HOSPITAL_COMMUNITY)
Admission: EM | Admit: 2014-07-03 | Discharge: 2014-07-03 | Disposition: A | Discharge status: Home / Self Care | Payer: Self-pay | Attending: Emergency Medicine | Admitting: Emergency Medicine

## 2014-07-03 ENCOUNTER — Encounter (HOSPITAL_COMMUNITY): Payer: Self-pay | Admitting: *Deleted

## 2014-07-03 DIAGNOSIS — I10 Essential (primary) hypertension: Secondary | ICD-10-CM | POA: Insufficient documentation

## 2014-07-03 DIAGNOSIS — Z7952 Long term (current) use of systemic steroids: Secondary | ICD-10-CM | POA: Insufficient documentation

## 2014-07-03 DIAGNOSIS — Z87442 Personal history of urinary calculi: Secondary | ICD-10-CM | POA: Insufficient documentation

## 2014-07-03 DIAGNOSIS — W01198A Fall on same level from slipping, tripping and stumbling with subsequent striking against other object, initial encounter: Secondary | ICD-10-CM | POA: Insufficient documentation

## 2014-07-03 DIAGNOSIS — S20229A Contusion of unspecified back wall of thorax, initial encounter: Secondary | ICD-10-CM

## 2014-07-03 DIAGNOSIS — Z8711 Personal history of peptic ulcer disease: Secondary | ICD-10-CM | POA: Insufficient documentation

## 2014-07-03 DIAGNOSIS — Z8719 Personal history of other diseases of the digestive system: Secondary | ICD-10-CM | POA: Insufficient documentation

## 2014-07-03 DIAGNOSIS — Y9289 Other specified places as the place of occurrence of the external cause: Secondary | ICD-10-CM | POA: Insufficient documentation

## 2014-07-03 DIAGNOSIS — Y9389 Activity, other specified: Secondary | ICD-10-CM | POA: Insufficient documentation

## 2014-07-03 DIAGNOSIS — Z720 Tobacco use: Secondary | ICD-10-CM | POA: Insufficient documentation

## 2014-07-03 DIAGNOSIS — Z791 Long term (current) use of non-steroidal anti-inflammatories (NSAID): Secondary | ICD-10-CM | POA: Insufficient documentation

## 2014-07-03 DIAGNOSIS — Y990 Civilian activity done for income or pay: Secondary | ICD-10-CM | POA: Insufficient documentation

## 2014-07-03 DIAGNOSIS — Z79899 Other long term (current) drug therapy: Secondary | ICD-10-CM | POA: Insufficient documentation

## 2014-07-03 MED ORDER — HYDROCODONE-ACETAMINOPHEN 5-325 MG PO TABS
1.0000 | ORAL_TABLET | Freq: Once | ORAL | Status: AC
  Administered 2014-07-03: 1 via ORAL
  Filled 2014-07-03: qty 1

## 2014-07-03 MED ORDER — IBUPROFEN 600 MG PO TABS: 600.0000 mg | ORAL_TABLET | Freq: Four times a day (QID) | ORAL | Status: DC | PRN

## 2014-07-03 MED ORDER — HYDROCODONE-ACETAMINOPHEN 5-325 MG PO TABS: 1.0000 | ORAL_TABLET | ORAL | Status: DC | PRN

## 2014-07-03 MED ORDER — IBUPROFEN 800 MG PO TABS
800.0000 mg | ORAL_TABLET | Freq: Once | ORAL | Status: AC
  Administered 2014-07-03: 800 mg via ORAL
  Filled 2014-07-03: qty 1

## 2014-07-03 NOTE — ED Provider Notes (Signed)
CSN: [id number]     Arrival date & time 07/03/14  1804 History   First MD Initiated Contact with Patient 07/03/14 1925     Chief Complaint  Patient presents with  . Back Injury     (Consider location/radiation/quality/duration/timing/severity/associated sxs/prior Treatment) The history is provided by the patient.   Jeremy Nichols is a 34 y.o. male presenting with persistent worsening pain in his upper back between his shoulder blades after striking it against a metal bar while at work this morning.  He was working on a plow when the wrench slipped causing him to fall against the machine.  He has had persistent worsening pain since the event despite resting and taking ibuprofen.  He reports he was unable to pick up his infant daughter prior to arrival secondary to pain, not weakness which concerns him.  He denies neck pain, lower back pain and has had no weakness or numbness in his extremities.  Denies urinary or bowel incontinence or retention.     Past Medical History  Diagnosis Date  . Peptic ulcer   . Hypertension   . Pain, dental   . Kidney stones   . Kidney stones   . Kidney stones    History reviewed. No pertinent past surgical history. History reviewed. No pertinent family history. History  Substance Use Topics  . Smoking status: Current Every Day Smoker -- 1.00 packs/day    Types: Cigarettes  . Smokeless tobacco: Not on file  . Alcohol Use: Yes     Comment: occasional    Review of Systems  Constitutional: Negative for fever and chills.  Respiratory: Negative for shortness of breath.   Cardiovascular: Negative for chest pain and leg swelling.  Gastrointestinal: Negative for abdominal pain, constipation and abdominal distention.  Genitourinary: Negative for dysuria, urgency, frequency, flank pain and difficulty urinating.  Musculoskeletal: Positive for back pain. Negative for joint swelling and gait problem.  Skin: Negative for rash.  Neurological: Negative for  weakness and numbness.      Allergies  Tramadol hcl  Home Medications   Prior to Admission medications   Medication Sig Start Date End Date Taking? Authorizing Provider  HYDROcodone-acetaminophen (NORCO/VICODIN) 5-325 MG per tablet Take 1 tablet by mouth every 4 (four) hours as needed. 07/03/14   [name], PA-C  ibuprofen (ADVIL,MOTRIN) 600 MG tablet Take 1 tablet (600 mg total) by mouth every 6 (six) hours as needed. 07/03/14   [name], PA-C  naproxen (NAPROSYN) 500 MG tablet Take 1 tablet (500 mg total) by mouth 2 (two) times daily. 04/22/14   Donnetta Hutching, MD  naproxen (NAPROSYN) 500 MG tablet Take 1 tablet (500 mg total) by mouth 2 (two) times daily. 05/12/14   Vanetta Mulders, MD  predniSONE (DELTASONE) 50 MG tablet 1 tablet for 6 days, one half tablet for 6 days 04/22/14   Donnetta Hutching, MD  Pseudoeph-Doxylamine-DM-APAP (NYQUIL PO) Take 30 mLs by mouth every 4 (four) hours as needed (for cold and cough).    Historical Provider, MD  ranitidine (ZANTAC) 75 MG tablet Take 75 mg by mouth daily as needed for heartburn.     Historical Provider, MD   BP 154/91 mmHg  Pulse 106  Temp(Src) 98 F (36.7 C) (Oral)  Resp 18  Ht 6' (1.829 m)  Wt 210 lb (95.255 kg)  BMI 28.47 kg/m2  SpO2 99% Physical Exam  Constitutional: He appears well-developed and well-nourished.  HENT:  Head: Normocephalic.  Eyes: Conjunctivae are normal.  Neck: Normal range of motion.  Neck supple.  Cardiovascular: Normal rate and intact distal pulses.   Pedal pulses normal.  Pulmonary/Chest: Effort normal.  Abdominal: Soft. Bowel sounds are normal. He exhibits no distension and no mass.  Musculoskeletal: Normal range of motion. He exhibits no edema.       Cervical back: He exhibits no tenderness and no bony tenderness.       Thoracic back: He exhibits bony tenderness. He exhibits no swelling, no edema and no deformity.       Lumbar back: He exhibits no tenderness, no swelling, no edema and no spasm.  Neurological: He  is alert. He has normal strength. He displays no atrophy and no tremor. No sensory deficit. Gait normal.  Reflex Scores:      Patellar reflexes are 2+ on the right side and 2+ on the left side.      Achilles reflexes are 2+ on the right side and 2+ on the left side. No strength deficit noted in hip and knee flexor and extensor muscle groups.  Ankle flexion and extension intact.  Skin: Skin is warm and dry.  Psychiatric: He has a normal mood and affect.  Nursing note and vitals reviewed.   ED Course  Procedures (including critical care time) Labs Review Labs Reviewed - No data to display  Imaging Review Dg Thoracic Spine 2 View  07/03/2014   CLINICAL DATA:  Injured back at work today.  EXAM: THORACIC SPINE - 2 VIEW  COMPARISON:  CT thoracic spine 08/12/2007  FINDINGS: Normal alignment of the thoracic vertebral bodies. Disc spaces and vertebral bodies are maintained. No acute compression fracture. No abnormal paraspinal soft tissue thickening. The visualized posterior ribs are intact.  IMPRESSION: Normal alignment and no acute bony findings.   Electronically Signed   By: Rudie MeyerP.  Gallerani M.D.   On: 07/03/2014 20:23     EKG Interpretation None      MDM   Final diagnoses:  Back contusion, unspecified laterality, initial encounter    Patients labs and/or radiological studies were reviewed and considered during the medical decision making and disposition process.  Results were also discussed with patient. Pt with probable soft tissue contusion given negative thoracic spine films.  He was encouraged ice pack, ibuprofen, hydrocodone, may add heat on day 3.  Prn f/u anticipated.      Burgess AmorJulie Monike Bragdon, PA-C 07/03/14 2106  Raeford RazorStephen Kohut, MD 07/04/14 [phone]

## 2014-07-03 NOTE — Discharge Instructions (Signed)
Contusion °A contusion is a deep bruise. Contusions are the result of an injury that caused bleeding under the skin. The contusion may turn blue, purple, or yellow. Minor injuries will give you a painless contusion, but more severe contusions may stay painful and swollen for a few weeks.  °CAUSES  °A contusion is usually caused by a blow, trauma, or direct force to an area of the body. °SYMPTOMS  °· Swelling and redness of the injured area. °· Bruising of the injured area. °· Tenderness and soreness of the injured area. °· Pain. °DIAGNOSIS  °The diagnosis can be made by taking a history and physical exam. An X-ray, CT scan, or MRI may be needed to determine if there were any associated injuries, such as fractures. °TREATMENT  °Specific treatment will depend on what area of the body was injured. In general, the best treatment for a contusion is resting, icing, elevating, and applying cold compresses to the injured area. Over-the-counter medicines may also be recommended for pain control. Ask your caregiver what the best treatment is for your contusion. °HOME CARE INSTRUCTIONS  °· Put ice on the injured area. °¨ Put ice in a plastic bag. °¨ Place a towel between your skin and the bag. °¨ Leave the ice on for 15-20 minutes, 3-4 times a day, or as directed by your health care provider. °· Only take over-the-counter or prescription medicines for pain, discomfort, or fever as directed by your caregiver. Your caregiver may recommend avoiding anti-inflammatory medicines (aspirin, ibuprofen, and naproxen) for 48 hours because these medicines may increase bruising. °· Rest the injured area. °· If possible, elevate the injured area to reduce swelling. °SEEK IMMEDIATE MEDICAL CARE IF:  °· You have increased bruising or swelling. °· You have pain that is getting worse. °· Your swelling or pain is not relieved with medicines. °MAKE SURE YOU:  °· Understand these instructions. °· Will watch your condition. °· Will get help right  away if you are not doing well or get worse. °Document Released: 12/14/2004 Document Revised: 03/11/2013 Document Reviewed: 01/09/2011 °ExitCare® Patient Information ©2015 [employer]. This information is not intended to replace advice given to you by your health care provider. Make sure you discuss any questions you have with your health care provider. ° °You may take the hydrocodone prescribed for pain relief.  This will make you drowsy - do not drive within 4 hours of taking this medication. ° ° °

## 2014-07-03 NOTE — ED Notes (Signed)
Pt was working on a low, wrench slipped, fell and hit back on a steel bar, pt states his back hurts and is having difficulty moving hands arms. Pt ambulated to triage without difficulty.

## 2014-07-20 ENCOUNTER — Emergency Department (HOSPITAL_COMMUNITY)
Admission: EM | Admit: 2014-07-20 | Discharge: 2014-07-20 | Disposition: A | Discharge status: Home / Self Care | Payer: Self-pay | Attending: Emergency Medicine | Admitting: Emergency Medicine

## 2014-07-20 ENCOUNTER — Encounter (HOSPITAL_COMMUNITY): Payer: Self-pay

## 2014-07-20 DIAGNOSIS — Z8711 Personal history of peptic ulcer disease: Secondary | ICD-10-CM | POA: Insufficient documentation

## 2014-07-20 DIAGNOSIS — Z79899 Other long term (current) drug therapy: Secondary | ICD-10-CM | POA: Insufficient documentation

## 2014-07-20 DIAGNOSIS — I10 Essential (primary) hypertension: Secondary | ICD-10-CM | POA: Insufficient documentation

## 2014-07-20 DIAGNOSIS — Z8719 Personal history of other diseases of the digestive system: Secondary | ICD-10-CM | POA: Insufficient documentation

## 2014-07-20 DIAGNOSIS — Z87442 Personal history of urinary calculi: Secondary | ICD-10-CM | POA: Insufficient documentation

## 2014-07-20 DIAGNOSIS — Z791 Long term (current) use of non-steroidal anti-inflammatories (NSAID): Secondary | ICD-10-CM | POA: Insufficient documentation

## 2014-07-20 DIAGNOSIS — M546 Pain in thoracic spine: Secondary | ICD-10-CM | POA: Insufficient documentation

## 2014-07-20 DIAGNOSIS — Z720 Tobacco use: Secondary | ICD-10-CM | POA: Insufficient documentation

## 2014-07-20 MED ORDER — CYCLOBENZAPRINE HCL 5 MG PO TABS: 5.0000 mg | ORAL_TABLET | Freq: Three times a day (TID) | ORAL | Status: DC | PRN

## 2014-07-20 MED ORDER — PREDNISONE 10 MG PO TABS: ORAL_TABLET | ORAL | Status: DC

## 2014-07-20 NOTE — ED Notes (Signed)
PA Julie at bedside.  

## 2014-07-20 NOTE — Discharge Instructions (Signed)
Back Pain, Adult Low back pain is very common. About 1 in 5 people have back pain.The cause of low back pain is rarely dangerous. The pain often gets better over time.About half of people with a sudden onset of back pain feel better in just 2 weeks. About 8 in 10 people feel better by 6 weeks.  CAUSES Some common causes of back pain include:  Strain of the muscles or ligaments supporting the spine.  Wear and tear (degeneration) of the spinal discs.  Arthritis.  Direct injury to the back. DIAGNOSIS Most of the time, the direct cause of low back pain is not known.However, back pain can be treated effectively even when the exact cause of the pain is unknown.Answering your caregiver's questions about your overall health and symptoms is one of the most accurate ways to make sure the cause of your pain is not dangerous. If your caregiver needs more information, he or she may order lab work or imaging tests (X-rays or MRIs).However, even if imaging tests show changes in your back, this usually does not require surgery. HOME CARE INSTRUCTIONS For many people, back pain returns.Since low back pain is rarely dangerous, it is often a condition that people can learn to manageon their own.   Remain active. It is stressful on the back to sit or stand in one place. Do not sit, drive, or stand in one place for more than 30 minutes at a time. Take short walks on level surfaces as soon as pain allows.Try to increase the length of time you walk each day.  Do not stay in bed.Resting more than 1 or 2 days can delay your recovery.  Do not avoid exercise or work.Your body is made to move.It is not dangerous to be active, even though your back may hurt.Your back will likely heal faster if you return to being active before your pain is gone.  Pay attention to your body when you bend and lift. Many people have less discomfortwhen lifting if they bend their knees, keep the load close to their bodies,and  avoid twisting. Often, the most comfortable positions are those that put less stress on your recovering back.  Find a comfortable position to sleep. Use a firm mattress and lie on your side with your knees slightly bent. If you lie on your back, put a pillow under your knees.  Only take over-the-counter or prescription medicines as directed by your caregiver. Over-the-counter medicines to reduce pain and inflammation are often the most helpful.Your caregiver may prescribe muscle relaxant drugs.These medicines help dull your pain so you can more quickly return to your normal activities and healthy exercise.  Put ice on the injured area.  Put ice in a plastic bag.  Place a towel between your skin and the bag.  Leave the ice on for 15-20 minutes, 03-04 times a day for the first 2 to 3 days. After that, ice and heat may be alternated to reduce pain and spasms.  Ask your caregiver about trying back exercises and gentle massage. This may be of some benefit.  Avoid feeling anxious or stressed.Stress increases muscle tension and can worsen back pain.It is important to recognize when you are anxious or stressed and learn ways to manage it.Exercise is a great option. SEEK MEDICAL CARE IF:  You have pain that is not relieved with rest or medicine.  You have pain that does not improve in 1 week.  You have new symptoms.  You are generally not feeling well. SEEK   IMMEDIATE MEDICAL CARE IF:   You have pain that radiates from your back into your legs.  You develop new bowel or bladder control problems.  You have unusual weakness or numbness in your arms or legs.  You develop nausea or vomiting.  You develop abdominal pain.  You feel faint. Document Released: 03/06/2005 Document Revised: 09/05/2011 Document Reviewed: 07/08/2013 [employer] Patient Information 2015 [employer]. This information is not intended to replace advice given to you by your health care provider. Make sure you  discuss any questions you have with your health care provider.  

## 2014-07-20 NOTE — ED Notes (Signed)
Patient states back pain X3 weeks. Patient was seen for this prior and is not improving.

## 2014-07-20 NOTE — ED Notes (Signed)
Patient denies pain and is resting comfortably.  

## 2014-07-22 NOTE — ED Provider Notes (Signed)
CSN: [id number]     Arrival date & time 07/20/14  2001 History   First MD Initiated Contact with Patient 07/20/14 2016     Chief Complaint  Patient presents with  . Back Pain     (Consider location/radiation/quality/duration/timing/severity/associated sxs/prior Treatment) The history is provided by the patient.   Jeremy Nichols is a 34 y.o. male with a history of upper back pain since being struck in the back when he fell against a metal bar at work about 3 weeks ago.  He was seen here the day of the injury at which time his xrays were negative for bony injury.  He was treated with ibuprofen and ibuprofen but denies any improvement with this treatment.  He endorses a tight sensation bilateral upper back which is worsened with movement and stretching his arms over his head. He has used heat therapy with little improvement.       Past Medical History  Diagnosis Date  . Peptic ulcer   . Hypertension   . Pain, dental   . Kidney stones   . Kidney stones   . Kidney stones    History reviewed. No pertinent past surgical history. History reviewed. No pertinent family history. History  Substance Use Topics  . Smoking status: Current Every Day Smoker -- 1.00 packs/day    Types: Cigarettes  . Smokeless tobacco: Not on file  . Alcohol Use: Yes     Comment: occasional    Review of Systems  Constitutional: Negative for fever.  Respiratory: Negative for shortness of breath.   Cardiovascular: Negative for chest pain and leg swelling.  Gastrointestinal: Negative for abdominal pain, constipation and abdominal distention.  Genitourinary: Negative for dysuria, urgency, frequency, flank pain and difficulty urinating.  Musculoskeletal: Positive for back pain. Negative for joint swelling and gait problem.  Skin: Negative for rash.  Neurological: Negative for weakness and numbness.      Allergies  Tramadol hcl  Home Medications   Prior to Admission medications   Medication Sig Start  Date End Date Taking? Authorizing Provider  cyclobenzaprine (FLEXERIL) 5 MG tablet Take 1 tablet (5 mg total) by mouth 3 (three) times daily as needed for muscle spasms. 07/20/14   Burgess AmorJulie Tonya Wantz, PA-C  HYDROcodone-acetaminophen (NORCO/VICODIN) 5-325 MG per tablet Take 1 tablet by mouth every 4 (four) hours as needed. 07/03/14   Burgess AmorJulie Milly Goggins, PA-C  ibuprofen (ADVIL,MOTRIN) 600 MG tablet Take 1 tablet (600 mg total) by mouth every 6 (six) hours as needed. 07/03/14   Burgess AmorJulie Zayd Bonet, PA-C  naproxen (NAPROSYN) 500 MG tablet Take 1 tablet (500 mg total) by mouth 2 (two) times daily. 04/22/14   Donnetta HutchingBrian Cook, MD  naproxen (NAPROSYN) 500 MG tablet Take 1 tablet (500 mg total) by mouth 2 (two) times daily. 05/12/14   Vanetta MuldersScott Zackowski, MD  predniSONE (DELTASONE) 10 MG tablet 6, 5, 4, 3, 2 then 1 tablet by mouth daily for 6 days total. 07/20/14   Burgess AmorJulie Shaneta Cervenka, PA-C  Pseudoeph-Doxylamine-DM-APAP (NYQUIL PO) Take 30 mLs by mouth every 4 (four) hours as needed (for cold and cough).    Historical Provider, MD  ranitidine (ZANTAC) 75 MG tablet Take 75 mg by mouth daily as needed for heartburn.     Historical Provider, MD   BP 149/97 mmHg  Pulse 119  Temp(Src) 99 F (37.2 C) (Oral)  Resp 20  Ht 6' (1.829 m)  Wt 210 lb (95.255 kg)  BMI 28.47 kg/m2  SpO2 98% Physical Exam  Constitutional: He appears well-developed and well-nourished.  HENT:  Head: Normocephalic.  Eyes: Conjunctivae are normal.  Neck: Normal range of motion. Neck supple.  Cardiovascular: Normal rate and intact distal pulses.   Triage VS reviewed. Pt pulse rechecked during exam 94  Pulmonary/Chest: Effort normal.  Abdominal: Soft. Bowel sounds are normal. He exhibits no distension and no mass.  Musculoskeletal: Normal range of motion. He exhibits no edema.       Thoracic back: He exhibits tenderness and spasm.       Lumbar back: He exhibits no tenderness.  Neurological: He is alert. He has normal strength. He displays no atrophy and no tremor. No sensory  deficit. Gait normal.  Reflex Scores:      Bicep reflexes are 2+ on the right side and 2+ on the left side. No strength deficit noted in upper extremities. Equal grip strength.  Skin: Skin is warm and dry.  Psychiatric: He has a normal mood and affect.  Nursing note and vitals reviewed.   ED Course  Procedures (including critical care time) Labs Review Labs Reviewed - No data to display  Imaging Review No results found.   EKG Interpretation None      MDM   Final diagnoses:  Midline thoracic back pain    Prior visit reviewed including xrays.  No indication for imaging today. He does have sx and exam findings suggesting muscle spasm.  Placed on flexeril and prednisone taper, encouraged continued heat tx.  Referral to Dr. Romeo AppleHarrison for further eval if sx persist.    Burgess AmorJulie Floye Fesler, PA-C 07/23/14 0007  Bethann BerkshireJoseph Zammit, MD 07/23/14 1321

## 2014-09-30 ENCOUNTER — Emergency Department (HOSPITAL_COMMUNITY)
Admission: EM | Admit: 2014-09-30 | Discharge: 2014-09-30 | Disposition: A | Discharge status: Home / Self Care | Payer: Self-pay | Attending: Emergency Medicine | Admitting: Emergency Medicine

## 2014-09-30 ENCOUNTER — Encounter (HOSPITAL_COMMUNITY): Payer: Self-pay | Admitting: Emergency Medicine

## 2014-09-30 ENCOUNTER — Emergency Department (HOSPITAL_COMMUNITY): Payer: Self-pay

## 2014-09-30 DIAGNOSIS — Z791 Long term (current) use of non-steroidal anti-inflammatories (NSAID): Secondary | ICD-10-CM | POA: Insufficient documentation

## 2014-09-30 DIAGNOSIS — Y998 Other external cause status: Secondary | ICD-10-CM | POA: Insufficient documentation

## 2014-09-30 DIAGNOSIS — S63601A Unspecified sprain of right thumb, initial encounter: Secondary | ICD-10-CM | POA: Insufficient documentation

## 2014-09-30 DIAGNOSIS — Z87442 Personal history of urinary calculi: Secondary | ICD-10-CM | POA: Insufficient documentation

## 2014-09-30 DIAGNOSIS — Z8711 Personal history of peptic ulcer disease: Secondary | ICD-10-CM | POA: Insufficient documentation

## 2014-09-30 DIAGNOSIS — Y9389 Activity, other specified: Secondary | ICD-10-CM | POA: Insufficient documentation

## 2014-09-30 DIAGNOSIS — Y9289 Other specified places as the place of occurrence of the external cause: Secondary | ICD-10-CM | POA: Insufficient documentation

## 2014-09-30 DIAGNOSIS — I10 Essential (primary) hypertension: Secondary | ICD-10-CM | POA: Insufficient documentation

## 2014-09-30 DIAGNOSIS — R000 Tachycardia, unspecified: Secondary | ICD-10-CM | POA: Insufficient documentation

## 2014-09-30 DIAGNOSIS — Z720 Tobacco use: Secondary | ICD-10-CM | POA: Insufficient documentation

## 2014-09-30 DIAGNOSIS — W228XXA Striking against or struck by other objects, initial encounter: Secondary | ICD-10-CM | POA: Insufficient documentation

## 2014-09-30 MED ORDER — HYDROCODONE-ACETAMINOPHEN 5-325 MG PO TABS: ORAL_TABLET | ORAL | Status: DC

## 2014-09-30 MED ORDER — IBUPROFEN 800 MG PO TABS: 800.0000 mg | ORAL_TABLET | Freq: Three times a day (TID) | ORAL | Status: DC

## 2014-09-30 NOTE — ED Provider Notes (Signed)
CSN: [id number]     Arrival date & time 09/30/14  1831 History   First MD Initiated Contact with Patient 09/30/14 1923     Chief Complaint  Patient presents with  . Hand Injury     (Consider location/radiation/quality/duration/timing/severity/associated sxs/prior Treatment) HPI  Jeremy Nichols is a 34 y.o. male who presents to the Emergency Department complaining of right thumb pain for 10 days.  He states that he accidentally struck his right thumb on a car wheel. He reports continued pain with movement of the right thumb and with gripping. He also notes mild swelling to his thumb. He denies numbness or weakness, wrist pain or elbow pain. He is taking over-the-counter analgesia except without relief.  Past Medical History  Diagnosis Date  . Peptic ulcer   . Hypertension   . Pain, dental   . Kidney stones   . Kidney stones   . Kidney stones    History reviewed. No pertinent past surgical history. History reviewed. No pertinent family history. History  Substance Use Topics  . Smoking status: Current Every Day Smoker -- 1.00 packs/day    Types: Cigarettes  . Smokeless tobacco: Not on file  . Alcohol Use: Yes     Comment: occasional    Review of Systems  Constitutional: Negative for fever and chills.  Genitourinary: Negative for dysuria and difficulty urinating.  Musculoskeletal: Positive for joint swelling and arthralgias.  Skin: Negative for color change and wound.  All other systems reviewed and are negative.     Allergies  Tramadol hcl  Home Medications   Prior to Admission medications   Medication Sig Start Date End Date Taking? Authorizing Provider  cyclobenzaprine (FLEXERIL) 5 MG tablet Take 1 tablet (5 mg total) by mouth 3 (three) times daily as needed for muscle spasms. Patient not taking: Reported on 09/30/2014 07/20/14   Burgess AmorJulie Idol, PA-C  HYDROcodone-acetaminophen (NORCO/VICODIN) 5-325 MG per tablet Take one-two tabs po q 4-6 hrs prn pain 09/30/14   Denisse Whitenack, PA-C  ibuprofen (ADVIL,MOTRIN) 800 MG tablet Take 1 tablet (800 mg total) by mouth 3 (three) times daily. 09/30/14   Orchid Glassberg, PA-C  naproxen (NAPROSYN) 500 MG tablet Take 1 tablet (500 mg total) by mouth 2 (two) times daily. Patient not taking: Reported on 09/30/2014 04/22/14   Donnetta HutchingBrian Cook, MD  naproxen (NAPROSYN) 500 MG tablet Take 1 tablet (500 mg total) by mouth 2 (two) times daily. Patient not taking: Reported on 09/30/2014 05/12/14   Vanetta MuldersScott Zackowski, MD  predniSONE (DELTASONE) 10 MG tablet 6, 5, 4, 3, 2 then 1 tablet by mouth daily for 6 days total. Patient not taking: Reported on 09/30/2014 07/20/14   Burgess AmorJulie Idol, PA-C  Pseudoeph-Doxylamine-DM-APAP (NYQUIL PO) Take 30 mLs by mouth every 4 (four) hours as needed (for cold and cough).    Historical Provider, MD  ranitidine (ZANTAC) 75 MG tablet Take 75 mg by mouth daily as needed for heartburn.     Historical Provider, MD   BP 150/84 mmHg  Pulse 124  Temp(Src) 98.7 F (37.1 C) (Oral)  Resp 20  Ht 6' (1.829 m)  Wt 210 lb (95.255 kg)  BMI 28.47 kg/m2  SpO2 97% Physical Exam  Constitutional: He is oriented to person, place, and time. He appears well-developed and well-nourished. No distress.  HENT:  Head: Normocephalic and atraumatic.  Neck: Normal range of motion. No thyromegaly present.  Cardiovascular: Regular rhythm, normal heart sounds and intact distal pulses.  Tachycardia present.   No murmur heard. Pulmonary/Chest:  Effort normal and breath sounds normal. No respiratory distress.  Musculoskeletal: He exhibits edema and tenderness.  Tender to palpation of the right proximal thumb. Mild edema of the thenar eminence. Radial pulse is brisk, distal sensation intact.  CR< 2 sec.  No bruising or bony deformity.  Right wrist is nontender.  Neurological: He is alert and oriented to person, place, and time. He exhibits normal muscle tone. Coordination normal.  Skin: Skin is warm and dry.  Nursing note and vitals reviewed.   ED  Course  Procedures (including critical care time) Labs Review Labs Reviewed - No data to display  Imaging Review Dg Hand Complete Right  09/30/2014   CLINICAL DATA:  Blunt injury to right thumb twelve days ago with persistent swelling and decreased range of motion, initial encounter  EXAM: RIGHT HAND - COMPLETE 3+ VIEW  COMPARISON:  01/01/2014  FINDINGS: There is no evidence of fracture or dislocation. There is no evidence of arthropathy or other focal bone abnormality. Soft tissues are unremarkable.  IMPRESSION: No acute abnormality noted.   Electronically Signed   By: Alcide Clever M.D.   On: 09/30/2014 19:15     EKG Interpretation None      MDM   Final diagnoses:  Sprain, thumb, right, initial encounter    Patient with likely sprain of right thumb. Neurovascularly intact. X-rays negative for fracture and no obvious ligament laxity on exam.  Thumb spica applied, pain improved. Patient given orthopedic referral and agrees to follow-up  Patient with reported history of tachycardia. He is asymptomatic at this time. Patient seen here previously and was also tachycardic. I've advised patient that he needs to establish primary care and gave referral for [facility].  He agrees to plan and appears stable for d/c   Pauline Aus, PA-C 09/30/14 2001  Donnetta Hutching, MD 09/30/14 2212

## 2014-09-30 NOTE — Discharge Instructions (Signed)
Thumb Sprain °Your exam shows you have a sprained thumb. This means the ligaments around the joint have been torn. Thumb sprains usually take 3-6 weeks to heal. However, severe, unstable sprains may need to be fixed surgically. Sometimes a small piece of bone is pulled off by the ligament. If this is not treated properly, a sprained thumb can lead to a painful, weak joint. Treatment helps reduce pain and shortens the period of disability. °The thumb, and often the wrist, must remain splinted for the first 2-4 weeks to protect the joint. Keep your hand elevated and apply ice packs frequently to the injured area (20-30 minutes every 2-3 hours) for the next 2-4 days. This helps reduce swelling and control pain. Pain medicine may also be used for several days. Motion and strengthening exercises may later be prescribed for the joint to return to normal function. Be sure to see your doctor for follow-up because your thumb joint may require further support with splints, bandages or tape. Please see your doctor or go to the emergency room right away if you have increased pain despite proper treatment, or a numb, cold, or pale thumb. °Document Released: 04/13/2004 Document Revised: 05/29/2011 Document Reviewed: 03/07/2008 °[employer]® Patient Information ©2015 [employer]. This information is not intended to replace advice given to you by your health care provider. Make sure you discuss any questions you have with your health care provider. ° °

## 2014-09-30 NOTE — ED Notes (Signed)
PT explained blunt force injury to right hand while working on a tire around 09/21/14. PT c/o hand pain increased with ROM and swelling noted to thumb and pad of hand.

## 2014-12-17 ENCOUNTER — Encounter (HOSPITAL_COMMUNITY): Payer: Self-pay

## 2014-12-17 ENCOUNTER — Emergency Department (HOSPITAL_COMMUNITY)
Admission: EM | Admit: 2014-12-17 | Discharge: 2014-12-17 | Disposition: A | Discharge status: Home / Self Care | Payer: Self-pay | Attending: Emergency Medicine | Admitting: Emergency Medicine

## 2014-12-17 DIAGNOSIS — K002 Abnormalities of size and form of teeth: Secondary | ICD-10-CM | POA: Insufficient documentation

## 2014-12-17 DIAGNOSIS — Z720 Tobacco use: Secondary | ICD-10-CM | POA: Insufficient documentation

## 2014-12-17 DIAGNOSIS — I10 Essential (primary) hypertension: Secondary | ICD-10-CM | POA: Insufficient documentation

## 2014-12-17 DIAGNOSIS — K047 Periapical abscess without sinus: Secondary | ICD-10-CM | POA: Insufficient documentation

## 2014-12-17 DIAGNOSIS — R51 Headache: Secondary | ICD-10-CM | POA: Insufficient documentation

## 2014-12-17 DIAGNOSIS — Z87442 Personal history of urinary calculi: Secondary | ICD-10-CM | POA: Insufficient documentation

## 2014-12-17 DIAGNOSIS — R599 Enlarged lymph nodes, unspecified: Secondary | ICD-10-CM | POA: Insufficient documentation

## 2014-12-17 LAB — BASIC METABOLIC PANEL
Anion gap: 5 (ref 5–15)
BUN: 9 mg/dL (ref 6–20)
CHLORIDE: 105 mmol/L (ref 101–111)
CO2: 27 mmol/L (ref 22–32)
CREATININE: 1.1 mg/dL (ref 0.61–1.24)
Calcium: 8.3 mg/dL — ABNORMAL LOW (ref 8.9–10.3)
GFR calc Af Amer: >60 mL/min (ref >60)
GFR calc non Af Amer: >60 mL/min (ref >60)
GLUCOSE: 106 mg/dL — AB (ref 65–99)
POTASSIUM: 4.1 mmol/L (ref 3.5–5.1)
SODIUM: 137 mmol/L (ref 135–145)

## 2014-12-17 MED ORDER — HYDROCODONE-ACETAMINOPHEN 5-325 MG PO TABS
1.0000 | ORAL_TABLET | Freq: Once | ORAL | Status: AC
  Administered 2014-12-17: 1 via ORAL
  Filled 2014-12-17: qty 1

## 2014-12-17 MED ORDER — AMOXICILLIN 500 MG PO CAPS: 500.0000 mg | ORAL_CAPSULE | Freq: Three times a day (TID) | ORAL | Status: AC

## 2014-12-17 MED ORDER — HYDROCODONE-ACETAMINOPHEN 5-325 MG PO TABS: 1.0000 | ORAL_TABLET | ORAL | Status: DC | PRN

## 2014-12-17 MED ORDER — AMOXICILLIN 250 MG PO CAPS
500.0000 mg | ORAL_CAPSULE | Freq: Once | ORAL | Status: AC
  Administered 2014-12-17: 500 mg via ORAL
  Filled 2014-12-17: qty 2

## 2014-12-17 MED ORDER — LISINOPRIL 10 MG PO TABS: 10.0000 mg | ORAL_TABLET | Freq: Every day | ORAL | Status: DC

## 2014-12-17 NOTE — ED Notes (Signed)
Pt having pain on lower left side teeth starting 2 days ago. Some swelling noted.

## 2014-12-17 NOTE — Discharge Instructions (Signed)
Dental Abscess A dental abscess is a collection of infected fluid (pus) from a bacterial infection in the inner part of the tooth (pulp). It usually occurs at the end of the tooth's root.  CAUSES   Severe tooth decay.  Trauma to the tooth that allows bacteria to enter into the pulp, such as a broken or chipped tooth. SYMPTOMS   Severe pain in and around the infected tooth.  Swelling and redness around the abscessed tooth or in the mouth or face.  Tenderness.  Pus drainage.  Bad breath.  Bitter taste in the mouth.  Difficulty swallowing.  Difficulty opening the mouth.  Nausea.  Vomiting.  Chills.  Swollen neck glands. DIAGNOSIS   A medical and dental history will be taken.  An examination will be performed by tapping on the abscessed tooth.  X-rays may be taken of the tooth to identify the abscess. TREATMENT The goal of treatment is to eliminate the infection. You may be prescribed antibiotic medicine to stop the infection from spreading. A root canal may be performed to save the tooth. If the tooth cannot be saved, it may be pulled (extracted) and the abscess may be drained.  HOME CARE INSTRUCTIONS  Only take over-the-counter or prescription medicines for pain, fever, or discomfort as directed by your caregiver.  Rinse your mouth (gargle) often with salt water ( tsp salt in 8 oz [250 ml] of warm water) to relieve pain or swelling.  Do not drive after taking pain medicine (narcotics).  Do not apply heat to the outside of your face.  Return to your dentist for further treatment as directed. SEEK MEDICAL CARE IF:  Your pain is not helped by medicine.  Your pain is getting worse instead of better. SEEK IMMEDIATE MEDICAL CARE IF:  You have a fever or persistent symptoms for more than 2-3 days.  You have a fever and your symptoms suddenly get worse.  You have chills or a very bad headache.  You have problems breathing or swallowing.  You have trouble  opening your mouth.  You have swelling in the neck or around the eye. Document Released: 03/06/2005 Document Revised: 11/29/2011 Document Reviewed: 06/14/2010 [facility] Patient Information 2015 [place], [place]. This information is not intended to replace advice given to you by your health care provider. Make sure you discuss any questions you have with your health care provider.  Hypertension Hypertension, commonly called high blood pressure, is when the force of blood pumping through your arteries is too strong. Your arteries are the blood vessels that carry blood from your heart throughout your body. A blood pressure reading consists of a higher number over a lower number, such as 110/72. The higher number (systolic) is the pressure inside your arteries when your heart pumps. The lower number (diastolic) is the pressure inside your arteries when your heart relaxes. Ideally you want your blood pressure below 120/80. Hypertension forces your heart to work harder to pump blood. Your arteries may become narrow or stiff. Having hypertension puts you at risk for heart disease, stroke, and other problems.  RISK FACTORS Some risk factors for high blood pressure are controllable. Others are not.  Risk factors you cannot control include:   Race. You may be at higher risk if you are African American.  Age. Risk increases with age.  Gender. Men are at higher risk than women before age 35 years. After age 85, women are at higher risk than men. Risk factors you can control include:  Not getting enough  exercise or physical activity.  Being overweight.  Getting too much fat, sugar, calories, or salt in your diet.  Drinking too much alcohol. SIGNS AND SYMPTOMS Hypertension does not usually cause signs or symptoms. Extremely high blood pressure (hypertensive crisis) may cause headache, anxiety, shortness of breath, and nosebleed. DIAGNOSIS  To check if you have hypertension, your health care provider  will measure your blood pressure while you are seated, with your arm held at the level of your heart. It should be measured at least twice using the same arm. Certain conditions can cause a difference in blood pressure between your right and left arms. A blood pressure reading that is higher than normal on one occasion does not mean that you need treatment. If one blood pressure reading is high, ask your health care provider about having it checked again. TREATMENT  Treating high blood pressure includes making lifestyle changes and possibly taking medicine. Living a healthy lifestyle can help lower high blood pressure. You may need to change some of your habits. Lifestyle changes may include:  Following the DASH diet. This diet is high in fruits, vegetables, and whole grains. It is low in salt, red meat, and added sugars.  Getting at least 2 hours of brisk physical activity every week.  Losing weight if necessary.  Not smoking.  Limiting alcoholic beverages.  Learning ways to reduce stress. If lifestyle changes are not enough to get your blood pressure under control, your health care provider may prescribe medicine. You may need to take more than one. Work closely with your health care provider to understand the risks and benefits. HOME CARE INSTRUCTIONS  Have your blood pressure rechecked as directed by your health care provider.   Take medicines only as directed by your health care provider. Follow the directions carefully. Blood pressure medicines must be taken as prescribed. The medicine does not work as well when you skip doses. Skipping doses also puts you at risk for problems.   Do not smoke.   Monitor your blood pressure at home as directed by your health care provider. SEEK MEDICAL CARE IF:   You think you are having a reaction to medicines taken.  You have recurrent headaches or feel dizzy.  You have swelling in your ankles.  You have trouble with your vision. SEEK  IMMEDIATE MEDICAL CARE IF:  You develop a severe headache or confusion.  You have unusual weakness, numbness, or feel faint.  You have severe chest or abdominal pain.  You vomit repeatedly.  You have trouble breathing. MAKE SURE YOU:   Understand these instructions.  Will watch your condition.  Will get help right away if you are not doing well or get worse. Document Released: 03/06/2005 Document Revised: 07/21/2013 Document Reviewed: 12/27/2012 [facility] Patient Information 2015 [place], [place]. This information is not intended to replace advice given to you by your health care provider. Make sure you discuss any questions you have with your health care provider.   Complete your entire course of antibiotics as prescribed.  You  may use the hydrocodone for pain relief but do not drive within 4 hours of taking as this will make you drowsy.  Avoid applying heat or ice to this abscess area which can worsen your symptoms.  You may use warm salt water swish and spit treatment or half peroxide and water swish and spit after meals to keep this area clean as discussed.  Call the dentist listed above for further management of your symptoms.  Also, start  taking your blood pressure medication.  This is on [facility] $4 list.  Call the clinic listed above to establish primary care and for management of your blood pressure and blood pressure medications.

## 2014-12-19 NOTE — ED Provider Notes (Signed)
CSN: [id number]     Arrival date & time 12/17/14  1130 History   First MD Initiated Contact with Patient 12/17/14 1147     Chief Complaint  Patient presents with  . Dental Pain    left side     (Consider location/radiation/quality/duration/timing/severity/associated sxs/prior Treatment) The history is provided by the patient.   Jeremy Nichols is a 34 y.o. male presenting with left lower molar dental pain with gingival swelling starting approximately 2 days ago.  He has a history of decay in the tooth involved which has recently started to cause increased  pain.  There has been no fevers, chills, nausea or vomiting, also no complaint of difficulty swallowing, although chewing makes pain worse.  The patient has taken ibuprofen without relief of symptoms. He has attempted to obtain f/u care with dentistry via the referrals given at his last ed visit here but has been difficulty without insurance.    Additionally noted is his elevated blood pressure today.  He reports has been out of his lisinopril for a long time as he has no pcp for refills.  He denies chest pain, sob, vision changes, peripheral edema but does endorse frequent headaches.  He denies any focal weakness, dizziness or other complaints.      Past Medical History  Diagnosis Date  . Peptic ulcer   . Hypertension   . Pain, dental   . Kidney stones   . Kidney stones   . Kidney stones    History reviewed. No pertinent past surgical history. No family history on file. Social History  Substance Use Topics  . Smoking status: Current Every Day Smoker -- 1.00 packs/day    Types: Cigarettes  . Smokeless tobacco: None  . Alcohol Use: Yes     Comment: occasional    Review of Systems  Constitutional: Negative for fever.  HENT: Positive for dental problem. Negative for facial swelling and sore throat.   Respiratory: Negative for shortness of breath.   Musculoskeletal: Negative for neck pain and neck stiffness.  Neurological:  Positive for headaches.      Allergies  Tramadol hcl  Home Medications   Prior to Admission medications   Medication Sig Start Date End Date Taking? Authorizing Provider  ibuprofen (ADVIL,MOTRIN) 200 MG tablet Take 1,000 mg by mouth every 6 (six) hours as needed for moderate pain.   Yes Historical Provider, MD  ranitidine (ZANTAC) 75 MG tablet Take 75 mg by mouth daily as needed for heartburn.    Yes Historical Provider, MD  amoxicillin (AMOXIL) 500 MG capsule Take 1 capsule (500 mg total) by mouth 3 (three) times daily. 12/17/14 12/27/14  [name], PA-C  HYDROcodone-acetaminophen (NORCO/VICODIN) 5-325 MG tablet Take 1 tablet by mouth every 4 (four) hours as needed. 12/17/14   [name], PA-C  lisinopril (PRINIVIL,ZESTRIL) 10 MG tablet Take 1 tablet (10 mg total) by mouth daily. 12/17/14   [name], PA-C   BP 162/91 mmHg  Pulse 88  Temp(Src) 97.9 F (36.6 C) (Oral)  Resp 16  Ht 6' (1.829 m)  Wt 210 lb (95.255 kg)  BMI 28.47 kg/m2  SpO2 99% Physical Exam  Constitutional: He is oriented to person, place, and time. He appears well-developed and well-nourished. No distress.  HENT:  Head: Normocephalic and atraumatic.  Right Ear: Tympanic membrane and external ear normal.  Left Ear: Tympanic membrane and external ear normal.  Mouth/Throat: Oropharynx is clear and moist and mucous membranes are normal. No oral lesions. No trismus in the  jaw. Abnormal dentition. No dental abscesses.  Poor dentition with several areas of decay, most notable along left lower molars, decay nearly to gingival line on occlusive surfaces.  Lateral gingival edema without fluctuance.  Cheek is nontender, no fluctuance, induration or erythema.  Sublingual space is soft.    Eyes: Conjunctivae and EOM are normal. Pupils are equal, round, and reactive to light.  Neck: Normal range of motion. Neck supple.  Cardiovascular: Normal rate, regular rhythm and normal heart sounds.   Pulmonary/Chest: Effort normal.   Abdominal: He exhibits no distension.  Musculoskeletal: Normal range of motion.  Lymphadenopathy:       Head (left side): Submandibular adenopathy present. No submental and no tonsillar adenopathy present.    He has no cervical adenopathy.    He has no axillary adenopathy.  Neurological: He is alert and oriented to person, place, and time.  Skin: Skin is warm and dry. No erythema.  Psychiatric: He has a normal mood and affect.    ED Course  Procedures (including critical care time) Labs Review Labs Reviewed  BASIC METABOLIC PANEL - Abnormal; Notable for the following:    Glucose, Bld 106 (*)    Calcium 8.3 (*)    All other components within normal limits    Imaging Review No results found. I have personally reviewed and evaluated these images and lab results as part of my medical decision-making.   EKG Interpretation None      MDM   Final diagnoses:  Dental abscess  Essential hypertension    Patients  labs reviewed.  Radiological studies were viewed, interpreted and considered during the medical decision making and disposition process. I agree with radiologists reading.  Results were also discussed with patient.   Pt with acute on chronic dental pain with early suspected abscess.  He was placed on amoxil, hydrocodone.  Also prescribed lisinopril for bp management.  He was referred to dentistry, also referred to [facility] for establishment with pcp. Encouraged to do this now so he can be seen prior to need for refill of his bp meds.  Pt understands and agrees with plan. Advised f/u here for any worsened swelling or pain at dental infection site.  The patient appears reasonably screened and/or stabilized for discharge and I doubt any other medical condition or other [facility] requiring further screening, evaluation, or treatment in the ED at this time prior to discharge.    [name], PA-C 12/19/14 1610  Marily Memos, MD 12/20/14 [phone]

## 2015-04-03 ENCOUNTER — Emergency Department (HOSPITAL_COMMUNITY)
Admission: EM | Admit: 2015-04-03 | Discharge: 2015-04-03 | Disposition: A | Discharge status: Home / Self Care | Payer: Self-pay | Attending: Emergency Medicine | Admitting: Emergency Medicine

## 2015-04-03 ENCOUNTER — Encounter (HOSPITAL_COMMUNITY): Payer: Self-pay | Admitting: Emergency Medicine

## 2015-04-03 ENCOUNTER — Emergency Department (HOSPITAL_COMMUNITY): Payer: Self-pay

## 2015-04-03 DIAGNOSIS — I10 Essential (primary) hypertension: Secondary | ICD-10-CM | POA: Insufficient documentation

## 2015-04-03 DIAGNOSIS — R110 Nausea: Secondary | ICD-10-CM | POA: Insufficient documentation

## 2015-04-03 DIAGNOSIS — Z8711 Personal history of peptic ulcer disease: Secondary | ICD-10-CM | POA: Insufficient documentation

## 2015-04-03 DIAGNOSIS — Z79899 Other long term (current) drug therapy: Secondary | ICD-10-CM | POA: Insufficient documentation

## 2015-04-03 DIAGNOSIS — R319 Hematuria, unspecified: Secondary | ICD-10-CM | POA: Insufficient documentation

## 2015-04-03 DIAGNOSIS — R109 Unspecified abdominal pain: Secondary | ICD-10-CM | POA: Insufficient documentation

## 2015-04-03 DIAGNOSIS — Z87442 Personal history of urinary calculi: Secondary | ICD-10-CM | POA: Insufficient documentation

## 2015-04-03 DIAGNOSIS — F17210 Nicotine dependence, cigarettes, uncomplicated: Secondary | ICD-10-CM | POA: Insufficient documentation

## 2015-04-03 LAB — URINALYSIS, ROUTINE W REFLEX MICROSCOPIC
Glucose, UA: NEGATIVE mg/dL
Ketones, ur: NEGATIVE mg/dL
LEUKOCYTES UA: NEGATIVE
Nitrite: NEGATIVE
PROTEIN: NEGATIVE mg/dL
SPECIFIC GRAVITY, URINE: 1.025 (ref 1.005–1.030)
pH: 6 (ref 5.0–8.0)

## 2015-04-03 LAB — URINE MICROSCOPIC-ADD ON: Squamous Epithelial / LPF: NONE SEEN

## 2015-04-03 MED ORDER — KETOROLAC TROMETHAMINE 60 MG/2ML IM SOLN
60.0000 mg | Freq: Once | INTRAMUSCULAR | Status: AC
  Administered 2015-04-03: 60 mg via INTRAMUSCULAR
  Filled 2015-04-03: qty 2

## 2015-04-03 MED ORDER — METHOCARBAMOL 500 MG PO TABS: 1000.0000 mg | ORAL_TABLET | Freq: Four times a day (QID) | ORAL | Status: DC | PRN

## 2015-04-03 MED ORDER — NAPROXEN 250 MG PO TABS: 250.0000 mg | ORAL_TABLET | Freq: Two times a day (BID) | ORAL | Status: DC | PRN

## 2015-04-03 NOTE — ED Notes (Signed)
Pt reports RT sided flank pain, dysuria, and nausea that began last night. Pt has hx of kidney stones.

## 2015-04-03 NOTE — ED Provider Notes (Signed)
CSN: [id number]     Arrival date & time 04/03/15  1637 History   First MD Initiated Contact with Patient 04/03/15 1652     Chief Complaint  Patient presents with  . Flank Pain      HPI  Pt was seen at 1710. Per pt, c/o sudden onset and persistence of constant right sided flank "pain" that began last night.  Pt describes the pain as "like my last kidney stone," and "sharp."  Has been associated with nausea.  Denies testicular pain/swelling, no dysuria/hematuria, no abd pain, no vomiting/diarrhea, no CP/SOB, no fevers, no rash.    Past Medical History  Diagnosis Date  . Peptic ulcer   . Hypertension   . Pain, dental   . Kidney stones    History reviewed. No pertinent past surgical history.  Social History  Substance Use Topics  . Smoking status: Current Every Day Smoker -- 1.00 packs/day    Types: Cigarettes  . Smokeless tobacco: None  . Alcohol Use: Yes     Comment: occasional    Review of Systems ROS: Statement: All systems negative except as marked or noted in the HPI; Constitutional: Negative for fever and chills. ; ; Eyes: Negative for eye pain, redness and discharge. ; ; ENMT: Negative for ear pain, hoarseness, nasal congestion, sinus pressure and sore throat. ; ; Cardiovascular: Negative for chest pain, palpitations, diaphoresis, dyspnea and peripheral edema. ; ; Respiratory: Negative for cough, wheezing and stridor. ; ; Gastrointestinal: +nausea. Negative for vomiting, diarrhea, abdominal pain, blood in stool, hematemesis, jaundice and rectal bleeding. . ; ; Genitourinary: +flank pain. Negative for dysuria and hematuria. ; ; Musculoskeletal: Negative for back pain and neck pain. Negative for swelling and trauma.; ; Skin: Negative for pruritus, rash, abrasions, blisters, bruising and skin lesion.; ; Neuro: Negative for headache, lightheadedness and neck stiffness. Negative for weakness, altered level of consciousness , altered mental status, extremity weakness, paresthesias,  involuntary movement, seizure and syncope.      Allergies  Tramadol hcl  Home Medications   Prior to Admission medications   Medication Sig Start Date End Date Taking? Authorizing Provider  HYDROcodone-acetaminophen (NORCO/VICODIN) 5-325 MG tablet Take 1 tablet by mouth every 4 (four) hours as needed. 12/17/14   Burgess AmorJulie Idol, PA-C  ibuprofen (ADVIL,MOTRIN) 200 MG tablet Take 1,000 mg by mouth every 6 (six) hours as needed for moderate pain.    Historical Provider, MD  lisinopril (PRINIVIL,ZESTRIL) 10 MG tablet Take 1 tablet (10 mg total) by mouth daily. 12/17/14   Burgess AmorJulie Idol, PA-C  ranitidine (ZANTAC) 75 MG tablet Take 75 mg by mouth daily as needed for heartburn.     Historical Provider, MD   BP 170/113 mmHg  Pulse 131  Temp(Src) 98 F (36.7 C)  Resp 24  Ht 6' (1.829 m)  Wt 200 lb (90.719 kg)  BMI 27.12 kg/m2  SpO2 99%  BP 142/89 mmHg  Pulse 107  Temp(Src) 98 F (36.7 C)  Resp 16  Ht 6' (1.829 m)  Wt 200 lb (90.719 kg)  BMI 27.12 kg/m2  SpO2 95%  Physical Exam  1715: Physical examination:  Nursing notes reviewed; Vital signs and O2 SAT reviewed;  Constitutional: Well developed, Well nourished, Well hydrated, In no acute distress, watching TV on my arrival to exam room.; Head:  Normocephalic, atraumatic; Eyes: EOMI, PERRL, No scleral icterus; ENMT: Mouth and pharynx normal, Mucous membranes moist; Neck: Supple, Full range of motion, No lymphadenopathy; Cardiovascular: Tachycardic rate and rhythm, No murmur, rub, or gallop;  Respiratory: Breath sounds clear & equal bilaterally, No rales, rhonchi, wheezes.  Speaking full sentences with ease, Normal respiratory effort/excursion; Chest: Nontender, Movement normal; Abdomen: Soft, Nontender, Nondistended, Normal bowel sounds; Genitourinary: No CVA tenderness; Extremities: Pulses normal, No tenderness, No edema, No calf edema or asymmetry.; Neuro: AA&Ox3, Major CN grossly intact.  Speech clear. No gross focal motor or sensory deficits in  extremities.; Skin: Color normal, Warm, Dry.   ED Course  Procedures (including critical care time) Labs Review   Imaging Review  I have personally reviewed and evaluated these images and lab results as part of my medical decision-making.   EKG Interpretation None      MDM  MDM Reviewed: previous chart, nursing note and vitals Reviewed previous: labs and CT scan Interpretation: labs and CT scan      Results for orders placed or performed during the hospital encounter of 04/03/15  Urinalysis, Routine w reflex microscopic (not at [facility])  Result Value Ref Range   Color, Urine YELLOW YELLOW   APPearance CLEAR CLEAR   Specific Gravity, Urine 1.025 1.005 - 1.030   pH 6.0 5.0 - 8.0   Glucose, UA NEGATIVE NEGATIVE mg/dL   Hgb urine dipstick MODERATE (A) NEGATIVE   Bilirubin Urine SMALL (A) NEGATIVE   Ketones, ur NEGATIVE NEGATIVE mg/dL   Protein, ur NEGATIVE NEGATIVE mg/dL   Nitrite NEGATIVE NEGATIVE   Leukocytes, UA NEGATIVE NEGATIVE  Urine microscopic-add on  Result Value Ref Range   Squamous Epithelial / LPF NONE SEEN NONE SEEN   WBC, UA 0-5 0 - 5 WBC/hpf   RBC / HPF 6-30 0 - 5 RBC/hpf   Bacteria, UA FEW (A) NONE SEEN   Crystals CA OXALATE CRYSTALS (A) NEGATIVE   Urine-Other MUCOUS PRESENT    Ct Renal Stone Study 04/03/2015  CLINICAL DATA:  Right lower quadrant and inguinal region pain. Dysuria. EXAM: CT ABDOMEN AND PELVIS WITHOUT CONTRAST TECHNIQUE: Multidetector CT imaging of the abdomen and pelvis was performed following the standard protocol without oral or intravenous contrast material administration. COMPARISON:  May 12, 2014 FINDINGS: Lower chest:  Lung bases are clear. Hepatobiliary: There is a tiny cyst near the dome of liver on the right. No other focal liver lesions are identified on this noncontrast enhanced study. Gallbladder wall is not appreciably thickened. There is no biliary duct dilatation. Pancreas: No pancreatic mass or inflammation. Spleen: No  splenic lesions are identified. Adrenals/Urinary Tract: Adrenals appear normal bilaterally. There is no renal mass or hydronephrosis on either side. On the left, there are two, 1 mm calculi in the upper pole region. There are two, 1 mm calculi in the midportion of the left kidney. There is a 4 x 3 mm calculus in the lower pole the left kidney. On the right, there is a 4 mm calculus with a nearby 1 mm calculus in the upper pole region. There is a 4 mm calculus with an adjacent 1 mm calculus in the posterior mid kidney on the right. There is a 2 mm calculus in the lower pole right kidney. No ureteral calculi are identified on either side. Urinary bladder is largely decompressed with wall thickness within normal limits given the degree of contraction of the bladder. Stomach/Bowel: There is no bowel wall or mesenteric thickening. Colon is largely decompressed. No bowel obstruction. No free air or portal venous air. Vascular/Lymphatic: There is atherosclerotic calcification in aorta. There is no abdominal aortic aneurysm. No vascular lesions are identified on this noncontrast enhanced study. There is no demonstrable  adenopathy in the abdomen or pelvis. Reproductive: Prostate and seminal vesicles appear normal. No pelvic mass or pelvic fluid collection. Other: Appendix appears normal. No abscess or ascites in the abdomen or pelvis. Musculoskeletal: There is a benign appearing stable lucent lesion with sclerotic periphery in the right femoral neck measuring 2.3 x 1.9 cm. No other focal bone lesions are identified. There is no intramuscular or abdominal wall lesion. IMPRESSION: Nonobstructing intrarenal calculi bilaterally. No hydronephrosis or ureteral calculus on either side. No bowel obstruction.  No abscess.  Appendix appears normal. Stable benign-appearing lesion proximal right femur. Electronically Signed   By: Bretta Bang III M.D.   On: 04/03/2015 17:38    1755:  No ureteral calculi on CT scan. Tx  symptomatically, f/u Uro MD. Dx and testing d/w pt.  Questions answered.  Verb understanding, agreeable to d/c home with outpt f/u.   [name], DO 04/07/15 1644

## 2015-04-03 NOTE — Discharge Instructions (Signed)
°Emergency Department Resource Guide °1) Find a Doctor and Pay Out of Pocket °Although you won't have to find out who is covered by your insurance plan, it is a good idea to ask around and get recommendations. You will then need to call the office and see if the doctor you have chosen will accept you as a new patient and what types of options they offer for patients who are self-pay. Some doctors offer discounts or will set up payment plans for their patients who do not have insurance, but you will need to ask so you aren't surprised when you get to your appointment. ° °2) Contact Your Local Health Department °Not all health departments have doctors that can see patients for sick visits, but many do, so it is worth a call to see if yours does. If you don't know where your local health department is, you can check in your phone book. The CDC also has a tool to help you locate your state's health department, and many state websites also have listings of all of their local health departments. ° °3) Find a Walk-in Clinic °If your illness is not likely to be very severe or complicated, you may want to try a walk in clinic. These are popping up all over the country in pharmacies, drugstores, and shopping centers. They're usually staffed by nurse practitioners or physician assistants that have been trained to treat common illnesses and complaints. They're usually fairly quick and inexpensive. However, if you have serious medical issues or chronic medical problems, these are probably not your best option. ° °No Primary Care Doctor: °- Call [facility] at  832-8000 - they can help you locate a primary care doctor that  accepts your insurance, provides certain services, etc. °- Physician Referral Service- 1-800-533-3463 ° °Chronic Pain Problems: °Organization         Address  Phone   Notes  °[facility]  (336) 297-2271 Patients need to be referred by their primary care doctor.  ° °Medication  Assistance: °Organization         Address  Phone   Notes  °[place] [facility] [address] °[place], [place] [address] (336) 641-8030 --Must be a resident of [place] °-- Must have NO insurance coverage whatsoever (no Medicaid/ Medicare, etc.) °-- The pt. MUST have a primary care doctor that directs their care regularly and follows them in the community °  °[facility]  (866) 331-1348   °[employer]  (888) 892-1162   ° °Agencies that provide inexpensive medical care: °Organization         Address  Phone   Notes  °[facility]  (336) 832-8035   °[facility]    (336) 832-7272   °[facility] [address] °[place], [place] [address] (336) 832-4777   °[facility] [address], °[place] (336) 271-4999   °[facility]    (336) 373-0678   °[facility]    (336) 272-1050   °[facility] ° [address], [place] Phone:  (336) 832-4444, Fax:  (336) 832-4440 Hours of Operation:  9 am - 6 pm, M-F.  Also accepts Medicaid/Medicare and self-pay.  °[facility] ° [address], [place] Phone: (336) 832-3150, Fax: (336) [phone]. Hours of Operation:  8:30 am - 5:30 pm, M-F.  Also accepts Medicaid and self-pay.  °[facility] [place] [address], [place] Phone: (336) 878-6027   °[facility] [address], [place], [place] (336)723-1848, Ext. 123 Mondays & Thursdays: 7-9 AM.  First 15 patients are seen on a first come, first serve basis. °  ° °Medicaid-accepting [place] Providers: ° °Organization         Address  Phone   Notes  °[facility] [address], [place] (336) 641-2100 Also accepts self-pay patients.  °[facility] [address], [place] ° (336) 856-9996   °[facility] [address], [place]  (336) 288-8857   °[facility] [address] [place] [address], [place] (336) 299-7000   °Veita Bland [address], [place]  ° (336) 373-1557 Only accepts [place] Access Medicaid patients after they have their name applied to their card.  ° °Self-Pay (no insurance) in [place]: ° °Organization         Address  Phone   Notes  °Sickle Cell Patients, [facility] [address], [place] (336) 832-1970   °[facility] [address], [place] (336) 832-4400   °[facility] ° [address], [place] (336) 992-4800   °[facility]/Dr. Osei-Bonsu ° [address] [place] [address], [place] or [address], [place] (336) 841-8500 Phone number for both [place] and [place] locations is the same.  °[facility] [address], [place] (336) 299-0000   °[facility] [address] [place] [address], [place] or [address] (336) 852-7530 °(336) 878-2260   °[facility] [address], [place] (336) 350-1642, phone; (336) 294-5005, fax Sees patients 1st and 3rd Saturday of every month.  Must not qualify for public or private insurance (i.e. Medicaid, Medicare, [place] Health Choice, Veterans' Benefits) • Household income should be no more than 200% of the poverty level •The clinic cannot treat you if you are pregnant or think you are pregnant • Sexually transmitted diseases are not treated at the clinic.  ° ° °Dental Care: °Organization         Address  Phone  Notes  °[place] [facility] [address], [place] (336) 641-6152 Accepts children up to age 21 who are enrolled in Medicaid or [place] Health Choice; pregnant women with a Medicaid card; and children who have applied for Medicaid or [place] Health Choice, but were declined, whose parents can pay a reduced fee at time of service.  °[place]  [facility] [place]  [address], [place] (336) 641-7733 Accepts children up to age 21 who are enrolled in Medicaid or [place] Health Choice; pregnant women with a Medicaid card; and children who have applied for Medicaid or [place] Health Choice, but were declined, whose parents can pay a reduced fee at time of service.  °[facility] ° [address], [place] (336) 641-4533 Patients are seen by appointment only. Walk-ins are not accepted. [facility] will see patients 18 years of age and older. °Monday - Tuesday (8am-5pm) °Most Wednesdays (8:30-5pm) °$30 per visit, cash only  °[facility] ° [address], [place] (336) 641-4533 Patients are seen by appointment only. Walk-ins are not accepted. [facility] will see patients 18 years of age and older. °One   Wednesday Evening (Monthly: Volunteer Based).  $30 per visit, cash only  °[facility]  (919) 537-3737 for adults; Children under age 4, call Graduate Pediatric Dentistry at ([phone]) [phone]. Children aged 4-14, please call ([phone]) [phone] to request a pediatric application. ° Dental services are provided in all areas of dental care including fillings, crowns and bridges, complete and partial dentures, implants, gum treatment, root canals, and extractions. Preventive care is also provided. Treatment is provided to both adults and children. °Patients are selected via a lottery and there is often a waiting list. °  °[facility] [address], °[place] ° (336) 763-8833 [email or web address] °  °[facility] [address], [place], [place] (336)723-1848, Ext. 123 Second and Fourth Thursday of each month, opens at 6:30 AM; Clinic ends at 9 AM.  Patients are seen on a first-come first-served basis, and a limited number are seen during each clinic.  ° °[facility] ° [address], [place], [place] (336) 723-7904    Eligibility Requirements °You must have lived in [place], [place], or [place] counties for at least the last three months. °  You cannot be eligible for state or federal sponsored healthcare insurance, including Veterans Administration, Medicaid, or Medicare. °  You generally cannot be eligible for healthcare insurance through your employer.  °  How to apply: °Eligibility screenings are held every Tuesday and Wednesday afternoon from 1:00 pm until 4:00 pm. You do not need an appointment for the interview!  °[facility] [address], [place], [place] 336-631-2330   °[facility]  336-342-8273   °[facility]  336-703-3100   °[facility]  336-570-6415   ° °Behavioral Health Resources in the Community: °Intensive Outpatient Programs °Organization         Address  Phone  Notes  °[facility] [address], [place], [place] 336-878-6098   °[facility] [address], [place], [place] 336-832-9800   °[facility] [address], [place], [place] ° 336-882-2125   °[facility] [address],  °[place], [place] 1-800-853-5163 or 336-641-4981   °Substance Abuse Resources °Organization         Address  Phone  Notes  °[facility]  336-882-2125   °[facility]  336-784-9470   °[facility]  336-285-9073   °[facility]  336-845-3988   °Residential & Outpatient Substance Abuse Program  1-800-659-3381   °Psychological Services °Organization         Address  Phone  Notes  °[facility]  336- 832-9600   °[facility]  336- 378-7881   °[facility] [address], [place] 1-800-853-5163 or 336-641-4981   ° °Mobile Crisis Teams °Organization         Address  Phone  Notes  °[facility], Mobile Crisis Care Unit  1-877-626-1772   °Assertive °[facility] ° [address]  [place], [place] 336-834-9664   °Sharon DeEsch [address] °[place] 336-554-5454   ° °Self-Help/Support Groups °Organization         Address  Phone             Notes  °[facility]  - variety of support groups  336- 373-1402 Call for more information  °Narcotics Anonymous (NA), [facility] [address], °[place]  2 meetings at this location  ° °  Residential Treatment Programs Organization         Address  Phone  Notes  [facility] [address],    [place]  [phone]   [facility]  [address], [place] [phone], [place], [place] [phone]   [facility] [address], [place] [phone] Admissions: 8am-3pm M-F  [facility] [address]    [place], [place] [phone]   [facility] [address], [place], [place] [phone](312) [phone]   [facility] [address]  [place], [place] [phone]   [facility] Intensive Outpatient [address], [place], [place] [phone]   [facility] ([facility] [address]  [place], [place] [phone] or (857) [phone]   [facility] [address] [place], [place] [phone] Accepts Medicaid  [facility] [address]  [place] [phone] Substance Abuse/Addiction Treatment   [facility]         Address  Phone  Notes  [facility]  720-611-4651(888) (803)[phone]   Angie FavaJulie Brannon, PhD [address], [address] [place], [place]   [phone](336) [phone] or ([phone](336) [phone]   [facility]   [address] [place], [place] [phone](336) [phone]   [facility] [address], [place], [place] [phone](336) [phone] Insurance/Medicaid/sponsorship through [facility] [address]                                    [place], [place] (934) [phone](336) [phone] Therapy/tele-psych/case    [facility] [address].   [place], [place] [phone](336) [phone]    Dr. Lolly MustacheArfeen  [phone](336) [phone]   [facility] [place]  [facility] 1) [address], [place] 2) [address], [place] 3)  [address], [place] [phone](336) [phone](336) ([phone]  [phone](336) [phone]   [facility] (930) 708-8295(336) 208-594-2423 or (938) 577-5327(336) (867)829-1634 (After Hours)      Take the prescriptions as directed.  Apply moist heat or ice to the area(s) of discomfort, for 15 minutes at a time, several times per day for the next few days.  Do not fall asleep on a heating or ice pack.  Call the Urologist on Monday to schedule a follow up appointment this week.   Your CT scan showed an incidental finding of: "There is a benign appearing stable lucent lesion with sclerotic periphery in the right femoral neck measuring 2.3 x 1.9 cm."  Call your family doctor on Monday to schedule a follow up appointment within the next week to follow up this finding.  Return to the Emergency Department immediately if worsening.

## 2015-04-05 ENCOUNTER — Encounter (HOSPITAL_COMMUNITY): Payer: Self-pay | Admitting: *Deleted

## 2015-04-05 ENCOUNTER — Emergency Department (HOSPITAL_COMMUNITY)
Admission: EM | Admit: 2015-04-05 | Discharge: 2015-04-05 | Disposition: A | Discharge status: Home / Self Care | Payer: Self-pay | Attending: Emergency Medicine | Admitting: Emergency Medicine

## 2015-04-05 DIAGNOSIS — Z79899 Other long term (current) drug therapy: Secondary | ICD-10-CM | POA: Insufficient documentation

## 2015-04-05 DIAGNOSIS — F17210 Nicotine dependence, cigarettes, uncomplicated: Secondary | ICD-10-CM | POA: Insufficient documentation

## 2015-04-05 DIAGNOSIS — N200 Calculus of kidney: Secondary | ICD-10-CM | POA: Insufficient documentation

## 2015-04-05 DIAGNOSIS — Z8711 Personal history of peptic ulcer disease: Secondary | ICD-10-CM | POA: Insufficient documentation

## 2015-04-05 DIAGNOSIS — I10 Essential (primary) hypertension: Secondary | ICD-10-CM | POA: Insufficient documentation

## 2015-04-05 LAB — URINALYSIS, ROUTINE W REFLEX MICROSCOPIC
Bilirubin Urine: NEGATIVE
GLUCOSE, UA: NEGATIVE mg/dL
HGB URINE DIPSTICK: NEGATIVE
KETONES UR: NEGATIVE mg/dL
Leukocytes, UA: NEGATIVE
Nitrite: NEGATIVE
PROTEIN: NEGATIVE mg/dL
Specific Gravity, Urine: 1.025 (ref 1.005–1.030)
pH: 5.5 (ref 5.0–8.0)

## 2015-04-05 MED ORDER — TAMSULOSIN HCL 0.4 MG PO CAPS: 0.4000 mg | ORAL_CAPSULE | Freq: Two times a day (BID) | ORAL | Status: DC

## 2015-04-05 MED ORDER — KETOROLAC TROMETHAMINE 60 MG/2ML IM SOLN
60.0000 mg | Freq: Once | INTRAMUSCULAR | Status: AC
  Administered 2015-04-05: 60 mg via INTRAMUSCULAR
  Filled 2015-04-05: qty 2

## 2015-04-05 MED ORDER — HYDROCODONE-ACETAMINOPHEN 5-325 MG PO TABS: 2.0000 | ORAL_TABLET | ORAL | Status: DC | PRN

## 2015-04-05 NOTE — ED Notes (Signed)
Pt comes in with lower abdominal pain. Pt was recently seen for a kidney stone, pt verbalizes he feels the stone has now moved into his bladder.

## 2015-04-05 NOTE — ED Notes (Signed)
Patient given discharge instruction, verbalized understand. Patient ambulatory out of the department.  

## 2015-04-05 NOTE — Discharge Instructions (Signed)
Your exam and or your xrays have shown that you likely have a kidney stone.  You should follow up with the Urologist of your choosing or the Urologist listed above in the next 2-3 days if you have not passed the stone.  You should urinate in to the strainer until you pass the stone.    Flomax helps with passing the stone by opening up the Ureters (tubes), Vicodin and an antiinflammatory for pain if you are not allergic to these medicines.  Phenergan or Zofran for nausea.  Return to the ER for severe or worsening pain, vomiting or fevers or if you are unable to control your pain with the medicines provided.  Kidney Stones Kidney stones (ureteral lithiasis) are deposits that form inside your kidneys. The intense pain is caused by the stone moving through the urinary tract. When the stone moves, the ureter goes into spasm around the stone. The stone is usually passed in the urine.  CAUSES  A disorder that makes certain neck glands produce too much parathyroid hormone (primary hyperparathyroidism).  A buildup of uric acid crystals.  Narrowing (stricture) of the ureter.  A kidney obstruction present at birth (congenital obstruction).  Previous surgery on the kidney or ureters.  Numerous kidney infections.  SYMPTOMS  Feeling sick to your stomach (nauseous).  Throwing up (vomiting).  Blood in the urine (hematuria).  Pain that usually spreads (radiates) to the groin.  Frequency or urgency of urination.  DIAGNOSIS  Taking a history and physical exam.  Blood or urine tests.  Computerized X-ray scan (CT scan).  Occasionally, an examination of the inside of the urinary bladder (cystoscopy) is performed.  TREATMENT  Observation.  Increasing your fluid intake.  Surgery may be needed if you have severe pain or persistent obstruction.  The size, location, and chemical composition are all important variables that will determine the proper choice of action for you. Talk to your caregiver to better  understand your situation so that you will minimize the risk of injury to yourself and your kidney.  HOME CARE INSTRUCTIONS  Drink enough water and fluids to keep your urine clear or pale yellow.  Strain all urine through the provided strainer. Keep all particulate matter and stones for your caregiver to see. The stone causing the pain may be as small as a grain of salt. It is very important to use the strainer each and every time you pass your urine. The collection of your stone will allow your caregiver to analyze it and verify that a stone has actually passed.  Only take over-the-counter or prescription medicines for pain, discomfort, or fever as directed by your caregiver.  Make a follow-up appointment with your caregiver as directed.  Get follow-up X-rays if required. The absence of pain does not always mean that the stone has passed. It may have only stopped moving. If the urine remains completely obstructed, it can cause loss of kidney function or even complete destruction of the kidney. It is your responsibility to make sure X-rays and follow-ups are completed. Ultrasounds of the kidney can show blockages and the status of the kidney. Ultrasounds are not associated with any radiation and can be performed easily in a matter of minutes.  SEEK IMMEDIATE MEDICAL CARE IF:  Pain cannot be controlled with the prescribed medicine.  You have a fever.  The severity or intensity of pain increases over 18 hours and is not relieved by pain medicine.  You develop a new onset of abdominal pain.  You   feel faint or pass out.  MAKE SURE YOU:  Understand these instructions.  Will watch your condition.  Will get help right away if you are not doing well or get worse.  Document Released: 03/06/2005 Document Revised: 02/23/2011 Document Reviewed: 07/02/2009 ExitCare Patient Information 2012 [employer].  RESOURCE GUIDE  Chronic Pain Problems: Contact [facility]  [phone] Patients  need to be referred by their primary care doctor.  Insufficient Money for Medicine: Contact United Way:  call "211" or [facility] 271-5999.  No Primary Care Doctor: Call [facility]  [phone] - can help you locate a primary care doctor that  accepts your insurance, provides certain services, etc. Physician Referral Service- 1-800-533-3463  Agencies that provide inexpensive medical care: [facility]  [phone] [facility]  [phone] [facility]  [phone] [facility]  [phone] [facility]  [phone] [facility]  [phone]  Medicaid-accepting [place] Providers: [facility]- [address]  [phone], Mon-Fri 9am-7pm, Sat 9am-1pm [facility]- [address], [address]  [phone] [facility]- [address], [address]  [phone] [facility]- [address] [place] [address]  [phone] [facility]- [address], [address], [phone]  Only accepts [place] Access Medicaid patients after they have their name  applied to their card  Self Pay (no insurance) in [place]: Sickle Cell Patients: Dr Eric Dean, [facility]  [address], [phone] [facility]- [address]  [phone]       -     [facility] - [address]       -     [facility]- see information above (Speak to Pam H if you do not have insurance)       -  [facility]- [address], [phone]       -  [facility] [place]- [address],  [phone]       -  [facility]- [address] [place] [address], [phone]       -  Dr Osei-Bonsu-  [address], [place], [phone]       -  [facility]- [address], [phone]       -  [facility]- [address] [place] [address], [phone], also [address], [phone]        -    [facility]- [address], [phone], 1st & 3rd Saturday   every month, 10am-1pm  1) Find a Doctor and Pay Out of Pocket Although you won't have to find out who is covered by your insurance plan, it is a good idea to ask around and get recommendations. You will then need to call the office and see if the doctor you have chosen will accept you as a new patient and what types of options they offer for patients who are self-pay. Some doctors offer discounts or will set up payment plans for their patients who do not have insurance, but you will need to ask so you aren't surprised when you get to your appointment.  2) Contact Your Local Health Department Not all health departments have doctors that can see patients for sick visits, but many do, so it is worth a call to see if yours does. If   you don't know where your local health department is, you can check in your phone book. The CDC also has a tool to help you locate your state's health department, and many state websites also have listings of all of their local health departments.  3) Find a Walk-in Clinic If your illness is not likely to be very severe or complicated, you may want to try a walk in clinic. These are popping up all over the country in pharmacies, drugstores, and shopping centers. They're usually staffed by nurse practitioners or physician assistants that have been trained to treat common illnesses and complaints. They're usually fairly quick and inexpensive. However, if you have serious medical issues or chronic medical problems, these are probably not your best option  STD Testing [place] [facility] [place], [facility], [address], [place], phone [phone] or [phone].  Monday - Friday, call for an appointment. [place] [facility] [place], [facility], [address], [place], phone [phone] or [phone].  Monday - Friday, call for an  appointment.  Abuse/Neglect: [place] Child Abuse Hotline (336) 641-3795 [place] Child Abuse Hotline 800-378-5315 (After Hours)  Emergency Shelter:   [facility] (336) 271-5985  Maternity Homes: [facility] (336) 275-9566 [facility] (704) 372-4663  MRSA Hotline #:   832-7006  [place] Resources  [facility] [place]  [employer] [place] [facility] [address]                 [address]         [address]  [place]                                               [place]                              [place] Phone:  [phone]                                  Phone:  [phone]                   Phone:  [phone]  [place] [facility], [phone] [place] Services - [facility]- 1-888-581-9988       -     [facility] in [place], [address],                                  336-349-4454, Insurance  [place] Child Abuse Hotline (336) 342-1394 or (336) 342-3537 (After Hours)   Behavioral Health Services  Substance Abuse Resources: [facility]  336-882-2125 [facility] 336-784-9470 [facility] 336-285-9073 [facility] 336-845-3988 Residential & Outpatient Substance Abuse Program  800-659-3381  Psychological Services: [facility]  832-9600 [facility]  [phone] [facility] [facility], [address], , ACCESS LINE: 1-800-853-5163 or 336-641-4981, Http://www.[facility].com/services/adult.htm  Dental Assistance  If unable to pay or uninsured, contact:  [facility] or [place] [facility] to become qualified for the adult dental clinic.  Patients   with Medicaid: [facility] [address], [phone], [phone]  If unable to pay, or uninsured, contact  [facility] ([phone]) or [place] [facility] ([phone] in [place], [phone] in [place]) to become qualified for the adult dental clinic  Other Low-Cost Community Dental Services: [facility]- [address], [place], [place], [address], [phone], [address]. [address], 2nd and 4th Thursday of the month at 6:30am.  10 clients each day by appointment, can sometimes see walk-in patients if someone does not show for an appointment. [facility]- [address], [place], [place], [address], [phone] [facility]- [address], [place], [place], [address], [phone] [place] [facility]- [phone] [facility]- [phone] [facility]- [phone]      

## 2015-04-05 NOTE — ED Provider Notes (Signed)
CSN: [id number]     Arrival date & time 04/05/15  1457 History   First MD Initiated Contact with Patient 04/05/15 1830     Chief Complaint  Patient presents with  . Abdominal Pain     (Consider location/radiation/quality/duration/timing/severity/associated sxs/prior Treatment) HPI Comments: The patient is a 35 year old male, he has a known history of multiple kidney stones, his last positive CT scan was 2014 when he had a distal right ureteral stone. He has since had 2 CAT scans the most recent of which was 2 days ago that showed multiple intrarenal kidney stones. He states that his pain is only been intermittent in the flanks but over the last 24 hours has felt as though some pain had radiating from his right flank down towards his right groin and now feels like he has pain in his penis with a stone that is stuck there. He denies any fevers vomiting or other problems. He has had significant pain today and has been only taking anti-inflammatories. Reviewed CT report from 2 days ago., Viewed images, patient was informed of these findings during my initial history of present illness  Patient is a 35 y.o. male presenting with abdominal pain. The history is provided by the patient.  Abdominal Pain   Past Medical History  Diagnosis Date  . Peptic ulcer   . Hypertension   . Pain, dental   . Kidney stones    History reviewed. No pertinent past surgical history. No family history on file. Social History  Substance Use Topics  . Smoking status: Current Every Day Smoker -- 1.00 packs/day    Types: Cigarettes  . Smokeless tobacco: None  . Alcohol Use: Yes     Comment: occasional    Review of Systems  Gastrointestinal: Positive for abdominal pain.  All other systems reviewed and are negative.     Allergies  Tramadol hcl  Home Medications   Prior to Admission medications   Medication Sig Start Date End Date Taking? Authorizing Provider  HYDROcodone-acetaminophen (NORCO/VICODIN)  5-325 MG tablet Take 2 tablets by mouth every 4 (four) hours as needed for moderate pain. 04/05/15   Eber HongBrian Wen [place], MD  ibuprofen (ADVIL,MOTRIN) 200 MG tablet Take 1,000 mg by mouth every 6 (six) hours as needed for moderate pain.    Historical Provider, MD  lisinopril (PRINIVIL,ZESTRIL) 10 MG tablet Take 1 tablet (10 mg total) by mouth daily. 12/17/14   Burgess AmorJulie Idol, PA-C  methocarbamol (ROBAXIN) 500 MG tablet Take 2 tablets (1,000 mg total) by mouth 4 (four) times daily as needed for muscle spasms (muscle spasm/pain). 04/03/15   Samuel JesterKathleen McManus, DO  naproxen (NAPROSYN) 250 MG tablet Take 1 tablet (250 mg total) by mouth 2 (two) times daily as needed for mild pain or moderate pain (take with food). 04/03/15   Samuel JesterKathleen McManus, DO  ranitidine (ZANTAC) 75 MG tablet Take 75 mg by mouth daily as needed for heartburn.     Historical Provider, MD  tamsulosin (FLOMAX) 0.4 MG CAPS capsule Take 1 capsule (0.4 mg total) by mouth 2 (two) times daily. 04/05/15   Eber HongBrian Cutter Passey, MD   BP 172/108 mmHg  Pulse 99  Temp(Src) 97.7 F (36.5 C) (Oral)  Ht 6' (1.829 m)  Wt 200 lb (90.719 kg)  BMI 27.12 kg/m2  SpO2 99% Physical Exam  Constitutional: He appears well-developed and well-nourished.  The patient appears uncomfortable  HENT:  Head: Normocephalic and atraumatic.  Mouth/Throat: Oropharynx is clear and moist. No oropharyngeal exudate.  Eyes: Conjunctivae and EOM are  normal. Pupils are equal, round, and reactive to light. Right eye exhibits no discharge. Left eye exhibits no discharge. No scleral icterus.  Neck: Normal range of motion. Neck supple. No JVD present. No thyromegaly present.  Cardiovascular: Normal rate, regular rhythm, normal heart sounds and intact distal pulses.  Exam reveals no gallop and no friction rub.   No murmur heard. Pulmonary/Chest: Effort normal and breath sounds normal. No respiratory distress. He has no wheezes. He has no rales.  Abdominal: Soft. Bowel sounds are normal. He exhibits no  distension and no mass. There is no tenderness.  No abdominal tenderness, no CVA tenderness  Musculoskeletal: Normal range of motion. He exhibits no edema or tenderness.  Lymphadenopathy:    He has no cervical adenopathy.  Neurological: He is alert. Coordination normal.  Skin: Skin is warm and dry. No rash noted. No erythema.  Psychiatric: He has a normal mood and affect. His behavior is normal.  Nursing note and vitals reviewed.   ED Course  Procedures (including critical care time) Labs Review Labs Reviewed  URINALYSIS, ROUTINE W REFLEX MICROSCOPIC (NOT AT [facility])    Imaging Review No results found. I have personally reviewed and evaluated these images and lab results as part of my medical decision-making.   MDM   Final diagnoses:  Kidney stone    Overall the patient is well-appearing, he does appear to be slightly colicky, he does have slight hypertension as well. I have encouraged him strongly to use his blood pressure medications. I have shown him his CT report from the other day, if he does have a kidney stone it appears to be small enough to pass. He will be given some pain medication here as well as a small amount for the evening. We'll also start Flomax in hopes that this will help. She is in agreement with the plan. He has no hematuria or dysuria.  Meds given in ED:  Medications  ketorolac (TORADOL) injection 60 mg (not administered)    New Prescriptions   HYDROCODONE-ACETAMINOPHEN (NORCO/VICODIN) 5-325 MG TABLET    Take 2 tablets by mouth every 4 (four) hours as needed for moderate pain.   TAMSULOSIN (FLOMAX) 0.4 MG CAPS CAPSULE    Take 1 capsule (0.4 mg total) by mouth 2 (two) times daily.      [name], MD 04/05/15 [phone]

## 2015-04-12 MED FILL — Hydrocodone-Acetaminophen Tab 5-325 MG: ORAL | Qty: 6 | Status: AC

## 2015-06-02 ENCOUNTER — Encounter (HOSPITAL_COMMUNITY): Payer: Self-pay | Admitting: Emergency Medicine

## 2015-06-02 DIAGNOSIS — K0889 Other specified disorders of teeth and supporting structures: Secondary | ICD-10-CM | POA: Insufficient documentation

## 2015-06-02 DIAGNOSIS — I10 Essential (primary) hypertension: Secondary | ICD-10-CM | POA: Insufficient documentation

## 2015-06-02 DIAGNOSIS — L988 Other specified disorders of the skin and subcutaneous tissue: Secondary | ICD-10-CM | POA: Insufficient documentation

## 2015-06-02 DIAGNOSIS — F17210 Nicotine dependence, cigarettes, uncomplicated: Secondary | ICD-10-CM | POA: Insufficient documentation

## 2015-06-02 NOTE — ED Notes (Signed)
Pt c/o dental pain since Sunday night.

## 2015-06-03 ENCOUNTER — Emergency Department (HOSPITAL_COMMUNITY)
Admission: EM | Admit: 2015-06-03 | Discharge: 2015-06-03 | Disposition: A | Discharge status: Home / Self Care | Payer: Self-pay | Attending: Emergency Medicine | Admitting: Emergency Medicine

## 2015-06-03 DIAGNOSIS — T698XXA Other specified effects of reduced temperature, initial encounter: Secondary | ICD-10-CM

## 2015-06-03 DIAGNOSIS — K0889 Other specified disorders of teeth and supporting structures: Secondary | ICD-10-CM

## 2015-06-03 MED ORDER — PENICILLIN V POTASSIUM 250 MG PO TABS
500.0000 mg | ORAL_TABLET | Freq: Once | ORAL | Status: AC
  Administered 2015-06-03: 500 mg via ORAL
  Filled 2015-06-03: qty 2

## 2015-06-03 MED ORDER — NAPROXEN 500 MG PO TABS: ORAL_TABLET | ORAL | Status: DC

## 2015-06-03 MED ORDER — ACETAMINOPHEN 500 MG PO TABS
1000.0000 mg | ORAL_TABLET | Freq: Once | ORAL | Status: AC
  Administered 2015-06-03: 1000 mg via ORAL
  Filled 2015-06-03: qty 2

## 2015-06-03 MED ORDER — PENICILLIN V POTASSIUM 500 MG PO TABS: 500.0000 mg | ORAL_TABLET | Freq: Four times a day (QID) | ORAL | Status: DC

## 2015-06-03 NOTE — Discharge Instructions (Signed)
Take acetaminophen 1000 mg 4 times a day with either ibuprofen 600 mg 4 times a day OR naproxen 500 mg twice a day. Take the antibiotics until gone. Keep your appointment with the dentist. You can try ambesol otc for pain. Recheck if you get a fever, facial swelling, or swelling in your mouth.  Use lubriderm on your hands. Make sure when you wash your hands you dry them well afterwards.

## 2015-06-03 NOTE — ED Provider Notes (Signed)
CSN: [id number]     Arrival date & time 06/02/15  2210 History  By signing my name below, I, [name], attest that this documentation has been prepared under the direction and in the presence of [name], MD at [phone] Electronically Signed: [name], ED Scribe. 06/03/2015. 12:51 AM.   Chief Complaint  Patient presents with  . Dental Pain   The history is provided by the patient. No language interpreter was used.   HPI Comments: Jeremy Nichols is a 35 y.o. male who presents to the Emergency Department with a PMHx of HTN and dental pain complaining of right upper molar dental pain that began 3 days ago. Pt takes 4 tablets of OTC Ibuprofen every 4-6 hours. Pt has scheduled an appointment with his dentist already next week on the 23rd. Pt denies any fevers, chills, or any other symptoms at this times. He also c/o a rash on the dorsum of his hands. He states he uses palmolive to wash his hands at work where he has worked for the past 2 years. Of not the temperature has been in the 30's today for the high.   PCP none  Past Medical History  Diagnosis Date  . Peptic ulcer   . Hypertension   . Pain, dental   . Kidney stones    History reviewed. No pertinent past surgical history. No family history on file. Social History  Substance Use Topics  . Smoking status: Current Every Day Smoker -- 1.00 packs/day    Types: Cigarettes  . Smokeless tobacco: None  . Alcohol Use: Yes     Comment: occasional  Pt smokes 1 pack a day.  Pt changes tires for work.    Review of Systems  Constitutional: Negative for fever and chills.  HENT: Positive for dental problem.   All other systems reviewed and are negative.  Allergies  Tramadol hcl  Home Medications   Prior to Admission medications   Medication Sig Start Date End Date Taking? Authorizing Provider  HYDROcodone-acetaminophen (NORCO/VICODIN) 5-325 MG tablet Take 2 tablets by mouth every 4 (four) hours as needed for moderate pain.  04/05/15   [name], MD  ibuprofen (ADVIL,MOTRIN) 200 MG tablet Take 1,000 mg by mouth every 6 (six) hours as needed for moderate pain.    Historical Provider, MD  lisinopril (PRINIVIL,ZESTRIL) 10 MG tablet Take 1 tablet (10 mg total) by mouth daily. 12/17/14   [name], PA-C  methocarbamol (ROBAXIN) 500 MG tablet Take 2 tablets (1,000 mg total) by mouth 4 (four) times daily as needed for muscle spasms (muscle spasm/pain). 04/03/15   [name], DO  naproxen (NAPROSYN) 500 MG tablet Take 1 po BID with food prn pain 06/03/15   [name], MD  penicillin v potassium (VEETID) 500 MG tablet Take 1 tablet (500 mg total) by mouth 4 (four) times daily. 06/03/15   [name], MD  ranitidine (ZANTAC) 75 MG tablet Take 75 mg by mouth daily as needed for heartburn.     Historical Provider, MD  tamsulosin (FLOMAX) 0.4 MG CAPS capsule Take 1 capsule (0.4 mg total) by mouth 2 (two) times daily. 04/05/15   [name], MD   BP 171/105 mmHg  Pulse 114  Temp(Src) 98.1 F (36.7 C) (Oral)  Resp 20  Ht 6' (1.829 m)  Wt 210 lb (95.255 kg)  BMI 28.47 kg/m2  SpO2 98%   Physical Exam  Constitutional: He is oriented to person, place, and time. He appears well-developed and well-nourished.  Non-toxic  appearance. He does not appear ill. No distress.  HENT:  Head: Normocephalic and atraumatic.  Right Ear: External ear normal.  Left Ear: External ear normal.  Nose: Nose normal. No mucosal edema or rhinorrhea.  Mouth/Throat: Oropharynx is clear and moist and mucous membranes are normal. No dental abscesses or uvula swelling.    Diffuse dental caries.   Eyes: Conjunctivae and EOM are normal.  Neck: Normal range of motion and full passive range of motion without pain.  Pulmonary/Chest: Effort normal. No respiratory distress. He has no rhonchi. He has no rales. He exhibits no crepitus.  Abdominal: Soft. Normal appearance and bowel sounds are normal. He exhibits no distension. There is no tenderness.    Musculoskeletal: Normal range of motion. He exhibits no edema or tenderness.  Moves all extremities well.   Neurological: He is alert and oriented to person, place, and time. He has normal strength. No cranial nerve deficit.  Skin: Skin is warm, dry and intact. No rash noted. No erythema. No pallor.  dorsum of both hands - he has dry skin that has the appears red and scaly consistent with chaped skin from the cold weather especially if his hands are damp    Psychiatric: He has a normal mood and affect. His speech is normal and behavior is normal. His mood appears not anxious.  Nursing note and vitals reviewed.   ED Course  Procedures   Medications  acetaminophen (TYLENOL) tablet 1,000 mg (not administered)  penicillin v potassium (VEETID) tablet 500 mg (not administered)     DIAGNOSTIC STUDIES: Oxygen Saturation is 98% on room air, normal by my interpretation.    COORDINATION OF CARE: 12:16 AM Discussed treatment plan with pt at bedside and pt agreed to plan. He was given acetaminophen for pain. He was started on Penicillin. We discussed drying skin after washing them and using lubriderm.    MDM   Final diagnoses:  Pain, dental  Chapped skin    New Prescriptions   NAPROXEN (NAPROSYN) 500 MG TABLET    Take 1 po BID with food prn pain   PENICILLIN V POTASSIUM (VEETID) 500 MG TABLET    Take 1 tablet (500 mg total) by mouth 4 (four) times daily.  lubridem otc  Plan discharge  Devoria AlbeIva Clio Gerhart, MD, FACEP   I personally performed the services described in this documentation, which was scribed in my presence. The recorded information has been reviewed and considered.  Devoria AlbeIva Julis Haubner, MD, [facility] PyoFACEP     [name], MD 06/03/15 [id number]

## 2015-09-09 ENCOUNTER — Emergency Department (HOSPITAL_COMMUNITY)
Admission: EM | Admit: 2015-09-09 | Discharge: 2015-09-09 | Disposition: A | Discharge status: Home / Self Care | Payer: Self-pay | Attending: Emergency Medicine | Admitting: Emergency Medicine

## 2015-09-09 ENCOUNTER — Emergency Department (HOSPITAL_COMMUNITY): Payer: Self-pay

## 2015-09-09 ENCOUNTER — Encounter (HOSPITAL_COMMUNITY): Payer: Self-pay | Admitting: Emergency Medicine

## 2015-09-09 DIAGNOSIS — R319 Hematuria, unspecified: Secondary | ICD-10-CM

## 2015-09-09 DIAGNOSIS — I10 Essential (primary) hypertension: Secondary | ICD-10-CM | POA: Insufficient documentation

## 2015-09-09 DIAGNOSIS — R109 Unspecified abdominal pain: Secondary | ICD-10-CM

## 2015-09-09 DIAGNOSIS — Z791 Long term (current) use of non-steroidal anti-inflammatories (NSAID): Secondary | ICD-10-CM | POA: Insufficient documentation

## 2015-09-09 DIAGNOSIS — R112 Nausea with vomiting, unspecified: Secondary | ICD-10-CM | POA: Insufficient documentation

## 2015-09-09 DIAGNOSIS — Z87442 Personal history of urinary calculi: Secondary | ICD-10-CM | POA: Insufficient documentation

## 2015-09-09 DIAGNOSIS — F17210 Nicotine dependence, cigarettes, uncomplicated: Secondary | ICD-10-CM | POA: Insufficient documentation

## 2015-09-09 LAB — COMPREHENSIVE METABOLIC PANEL
ALT: 25 U/L (ref 17–63)
ANION GAP: 7 (ref 5–15)
AST: 21 U/L (ref 15–41)
Albumin: 4.5 g/dL (ref 3.5–5.0)
Alkaline Phosphatase: 73 U/L (ref 38–126)
BUN: 8 mg/dL (ref 6–20)
CHLORIDE: 104 mmol/L (ref 101–111)
CO2: 26 mmol/L (ref 22–32)
Calcium: 9.2 mg/dL (ref 8.9–10.3)
Creatinine, Ser: 0.89 mg/dL (ref 0.61–1.24)
Glucose, Bld: 102 mg/dL — ABNORMAL HIGH (ref 65–99)
POTASSIUM: 4.1 mmol/L (ref 3.5–5.1)
Sodium: 137 mmol/L (ref 135–145)
Total Bilirubin: 0.9 mg/dL (ref 0.3–1.2)
Total Protein: 8.1 g/dL (ref 6.5–8.1)

## 2015-09-09 LAB — CBC
HEMATOCRIT: 49.6 % (ref 39.0–52.0)
HEMOGLOBIN: 17.4 g/dL — AB (ref 13.0–17.0)
MCH: 34.1 pg — ABNORMAL HIGH (ref 26.0–34.0)
MCHC: 35.1 g/dL (ref 30.0–36.0)
MCV: 97.1 fL (ref 78.0–100.0)
Platelets: 302 10*3/uL (ref 150–400)
RBC: 5.11 MIL/uL (ref 4.22–5.81)
RDW: 11.9 % (ref 11.5–15.5)
WBC: 10.4 10*3/uL (ref 4.0–10.5)

## 2015-09-09 LAB — LIPASE, BLOOD: Lipase: 29 U/L (ref 11–51)

## 2015-09-09 LAB — URINALYSIS, ROUTINE W REFLEX MICROSCOPIC
Bilirubin Urine: NEGATIVE
GLUCOSE, UA: NEGATIVE mg/dL
Ketones, ur: NEGATIVE mg/dL
LEUKOCYTES UA: NEGATIVE
Nitrite: NEGATIVE
PH: 6 (ref 5.0–8.0)
SPECIFIC GRAVITY, URINE: 1.02 (ref 1.005–1.030)

## 2015-09-09 LAB — URINE MICROSCOPIC-ADD ON

## 2015-09-09 MED ORDER — SODIUM CHLORIDE 0.9 % IV SOLN
INTRAVENOUS | Status: DC
  Administered 2015-09-09: 16:00:00 via INTRAVENOUS

## 2015-09-09 MED ORDER — KETOROLAC TROMETHAMINE 30 MG/ML IJ SOLN
30.0000 mg | Freq: Once | INTRAMUSCULAR | Status: AC
  Administered 2015-09-09: 30 mg via INTRAVENOUS
  Filled 2015-09-09: qty 1

## 2015-09-09 MED ORDER — KETOROLAC TROMETHAMINE 30 MG/ML IJ SOLN
60.0000 mg | Freq: Once | INTRAMUSCULAR | Status: DC
  Filled 2015-09-09: qty 2

## 2015-09-09 MED ORDER — PROMETHAZINE HCL 25 MG PO TABS: 25.0000 mg | ORAL_TABLET | Freq: Four times a day (QID) | ORAL | Status: DC | PRN

## 2015-09-09 MED ORDER — NAPROXEN 500 MG PO TABS: ORAL_TABLET | ORAL | Status: DC

## 2015-09-09 NOTE — ED Provider Notes (Signed)
CSN: [id number]     Arrival date & time 09/09/15  1348 History   First MD Initiated Contact with Patient 09/09/15 1534     Chief Complaint  Patient presents with  . Flank Pain   Patient is a 35 y.o. male presenting with flank pain. The history is provided by the patient.  Flank Pain This is a new problem. The current episode started 12 to 24 hours ago (2am). The problem occurs constantly. The problem has not changed since onset.Pertinent negatives include no headaches and no shortness of breath. Associated symptoms comments: Pain is on the right side in the upper abdomen . Nothing aggravates the symptoms. Nothing relieves the symptoms. Treatments tried: ibuprofen. The treatment provided no relief.  Pt complains of right flank pain and hematuria.  Pain is sharp and severe.  It does not get worse with movement or eating.  He noticed blood today and has had some nausea and vomiting.  Past Medical History  Diagnosis Date  . Peptic ulcer   . Hypertension   . Pain, dental   . Kidney stones    History reviewed. No pertinent past surgical history. No family history on file. Social History  Substance Use Topics  . Smoking status: Current Every Day Smoker -- 1.00 packs/day    Types: Cigarettes  . Smokeless tobacco: None  . Alcohol Use: Yes     Comment: occasional    Review of Systems  Respiratory: Negative for shortness of breath.   Gastrointestinal: Positive for vomiting.  Genitourinary: Positive for hematuria and flank pain.  Neurological: Negative for headaches.  All other systems reviewed and are negative.     Allergies  Tramadol hcl  Home Medications   Prior to Admission medications   Medication Sig Start Date End Date Taking? Authorizing Provider  ibuprofen (ADVIL,MOTRIN) 200 MG tablet Take 800 mg by mouth every 6 (six) hours as needed for moderate pain.    Yes Historical Provider, MD  HYDROcodone-acetaminophen (NORCO/VICODIN) 5-325 MG tablet Take 2 tablets by mouth every  4 (four) hours as needed for moderate pain. Patient not taking: Reported on 09/09/2015 04/05/15   Eber HongBrian Miller, MD  lisinopril (PRINIVIL,ZESTRIL) 10 MG tablet Take 1 tablet (10 mg total) by mouth daily. Patient not taking: Reported on 09/09/2015 12/17/14   Burgess AmorJulie Idol, PA-C  methocarbamol (ROBAXIN) 500 MG tablet Take 2 tablets (1,000 mg total) by mouth 4 (four) times daily as needed for muscle spasms (muscle spasm/pain). Patient not taking: Reported on 09/09/2015 04/03/15   Samuel JesterKathleen McManus, DO  naproxen (NAPROSYN) 500 MG tablet Take 1 po BID with food prn pain 09/09/15   Linwood DibblesJon Esthela Brandner, MD  penicillin v potassium (VEETID) 500 MG tablet Take 1 tablet (500 mg total) by mouth 4 (four) times daily. Patient not taking: Reported on 09/09/2015 06/03/15   Devoria AlbeIva Kino Dunsworth, MD  promethazine (PHENERGAN) 25 MG tablet Take 1 tablet (25 mg total) by mouth every 6 (six) hours as needed for nausea or vomiting. 09/09/15   Linwood DibblesJon Jakiya Bookbinder, MD  ranitidine (ZANTAC) 75 MG tablet Take 75 mg by mouth daily as needed for heartburn.     Historical Provider, MD  tamsulosin (FLOMAX) 0.4 MG CAPS capsule Take 1 capsule (0.4 mg total) by mouth 2 (two) times daily. Patient not taking: Reported on 09/09/2015 04/05/15   Eber HongBrian Miller, MD   BP 140/97 mmHg  Pulse 82  Temp(Src) 97.8 F (36.6 C) (Oral)  Resp 18  Ht 6' (1.829 m)  Wt 95.255 kg  BMI 28.47 kg/m2  SpO2 99% Physical Exam  Constitutional: He appears well-developed and well-nourished. No distress.  HENT:  Head: Normocephalic and atraumatic.  Right Ear: External ear normal.  Left Ear: External ear normal.  Eyes: Conjunctivae are normal. Right eye exhibits no discharge. Left eye exhibits no discharge. No scleral icterus.  Neck: Neck supple. No tracheal deviation present.  Cardiovascular: Normal rate, regular rhythm and intact distal pulses.   Pulmonary/Chest: Effort normal and breath sounds normal. No stridor. No respiratory distress. He has no wheezes. He has no rales.  Abdominal: Soft.  Bowel sounds are normal. He exhibits no distension. There is tenderness in the right upper quadrant. There is no rebound and no guarding.  Musculoskeletal: He exhibits no edema or tenderness.  Neurological: He is alert. He has normal strength. No cranial nerve deficit (no facial droop, extraocular movements intact, no slurred speech) or sensory deficit. He exhibits normal muscle tone. He displays no seizure activity. Coordination normal.  Skin: Skin is warm and dry. No rash noted.  Psychiatric: He has a normal mood and affect.  Nursing note and vitals reviewed.   ED Course  Procedures (including critical care time) Labs Review Labs Reviewed  COMPREHENSIVE METABOLIC PANEL - Abnormal; Notable for the following:    Glucose, Bld 102 (*)    All other components within normal limits  CBC - Abnormal; Notable for the following:    Hemoglobin 17.4 (*)    MCH 34.1 (*)    All other components within normal limits  URINALYSIS, ROUTINE W REFLEX MICROSCOPIC (NOT AT [facility]) - Abnormal; Notable for the following:    Hgb urine dipstick LARGE (*)    Protein, ur TRACE (*)    All other components within normal limits  URINE MICROSCOPIC-ADD ON - Abnormal; Notable for the following:    Squamous Epithelial / LPF 0-5 (*)    Bacteria, UA FEW (*)    All other components within normal limits  LIPASE, BLOOD    Imaging Review Ct Renal Stone Study  09/09/2015  CLINICAL DATA:  Right-sided flank pain and hematuria. History of renal calculi. EXAM: CT ABDOMEN AND PELVIS WITHOUT CONTRAST TECHNIQUE: Multidetector CT imaging of the abdomen and pelvis was performed following the standard protocol without IV contrast. COMPARISON:  04/03/2015 FINDINGS: Lower chest:  Unremarkable. Hepatobiliary: Unremarkable unenhanced appearance of the liver and gallbladder. Pancreas: Unremarkable unenhanced appearance. Spleen: Unremarkable unenhanced appearance. Adrenals/Urinary Tract: Normal unenhanced appearance of the adrenal glands.  Nonobstructing bilateral renal calculi again identified. The largest in the lower pole collecting system of the right kidney measures approximately 5 mm. The largest left renal calculus measures approximately 3 mm and is within the lower pole. No ureteral or bladder calculi identified. No focal renal lesions identified by unenhanced CT. Stomach/Bowel: Bowel shows no evidence of obstruction or inflammation. No free air, abscess or free fluid identified. Vascular/Lymphatic: No enlarged lymph nodes are seen. Mild calcified plaque present in the abdominal aorta. Reproductive: Negative. Other: No hernias identified. Musculoskeletal: Bony structures are normal. IMPRESSION: 1. Stable bilateral nonobstructing renal calculi without evidence of renal obstruction or ureteral calculi. 2. Aortic atherosclerosis. Electronically Signed   By: Irish LackGlenn  Yamagata M.D.   On: 09/09/2015 17:13   I have personally reviewed and evaluated these images and lab results as part of my medical decision-making.    MDM   Final diagnoses:  Flank pain  Hematuria    Patient's CT scan shows stable bilateral ureteral stones without any evidence of renal obstruction or ureteral calculi. Patient does have hematuria however  this has been noted multiple times. Recommend he follow up with urologist.  It's possible his symptoms today may have been related to a recently passed ureteral stone but there is no evidence of any current stone to cause persistent renal colic.    Linwood Dibbles, MD 09/09/15 1740

## 2015-09-09 NOTE — Discharge Instructions (Signed)
Flank Pain °Flank pain refers to pain that is located on the side of the body between the upper abdomen and the back. The pain may occur over a short period of time (acute) or may be long-term or reoccurring (chronic). It may be mild or severe. Flank pain can be caused by many things. °CAUSES  °Some of the more common causes of flank pain include: °· Muscle strains.   °· Muscle spasms.   °· A disease of your spine (vertebral disk disease).   °· A lung infection (pneumonia).   °· Fluid around your lungs (pulmonary edema).   °· A kidney infection.   °· Kidney stones.   °· A very painful skin rash caused by the chickenpox virus (shingles).   °· Gallbladder disease.   °HOME CARE INSTRUCTIONS  °Home care will depend on the cause of your pain. In general, °· Rest as directed by your caregiver. °· Drink enough fluids to keep your urine clear or pale yellow. °· Only take over-the-counter or prescription medicines as directed by your caregiver. Some medicines may help relieve the pain. °· Tell your caregiver about any changes in your pain. °· Follow up with your caregiver as directed. °SEEK IMMEDIATE MEDICAL CARE IF:  °· Your pain is not controlled with medicine.   °· You have new or worsening symptoms. °· Your pain increases.   °· You have abdominal pain.   °· You have shortness of breath.   °· You have persistent nausea or vomiting.   °· You have swelling in your abdomen.   °· You feel faint or pass out.   °· You have blood in your urine. °· You have a fever or persistent symptoms for more than 2-3 days. °· You have a fever and your symptoms suddenly get worse. °MAKE SURE YOU:  °· Understand these instructions. °· Will watch your condition. °· Will get help right away if you are not doing well or get worse. °  °This information is not intended to replace advice given to you by your health care provider. Make sure you discuss any questions you have with your health care provider. °  °Document Released: 04/27/2005 Document  Revised: 11/29/2011 Document Reviewed: 10/19/2011 °Elsevier Interactive Patient Education ©2016 [employer] ° °Hematuria, Adult °Hematuria is blood in your urine. It can be caused by a bladder infection, kidney infection, prostate infection, kidney stone, or cancer of your urinary tract. Infections can usually be treated with medicine, and a kidney stone usually will pass through your urine. If neither of these is the cause of your hematuria, further workup to find out the reason may be needed. °It is very important that you tell your health care provider about any blood you see in your urine, even if the blood stops without treatment or happens without causing pain. Blood in your urine that happens and then stops and then happens again can be a symptom of a very serious condition. Also, pain is not a symptom in the initial stages of many urinary cancers. °HOME CARE INSTRUCTIONS  °· Drink lots of fluid, 3-4 quarts a day. If you have been diagnosed with an infection, cranberry juice is especially recommended, in addition to large amounts of water. °· Avoid caffeine, tea, and carbonated beverages because they tend to irritate the bladder. °· Avoid alcohol because it may irritate the prostate. °· Take all medicines as directed by your health care provider. °· If you were prescribed an antibiotic medicine, finish it all even if you start to feel better. °· If you have been diagnosed with a kidney stone, follow your   health care provider's instructions regarding straining your urine to catch the stone. °· Empty your bladder often. Avoid holding urine for long periods of time. °· After a bowel movement, women should cleanse front to back. Use each tissue only once. °· Empty your bladder before and after sexual intercourse if you are a male. °SEEK MEDICAL CARE IF: °· You develop back pain. °· You have a fever. °· You have a feeling of sickness in your stomach (nausea) or vomiting. °· Your symptoms are not better in 3  days. Return sooner if you are getting worse. °SEEK IMMEDIATE MEDICAL CARE IF:  °· You develop severe vomiting and are unable to keep the medicine down. °· You develop severe back or abdominal pain despite taking your medicines. °· You begin passing a large amount of blood or clots in your urine. °· You feel extremely weak or faint, or you pass out. °MAKE SURE YOU:  °· Understand these instructions. °· Will watch your condition. °· Will get help right away if you are not doing well or get worse. °  °This information is not intended to replace advice given to you by your health care provider. Make sure you discuss any questions you have with your health care provider. °  °Document Released: 03/06/2005 Document Revised: 03/27/2014 Document Reviewed: 11/04/2012 °Elsevier Interactive Patient Education ©2016 [employer] Inc[employer] ° °

## 2015-09-09 NOTE — ED Notes (Signed)
Pt c/o RT flank pain with vomiting and hematuria. States it began around 0200 this morning. Pt has hx of kidney stones.

## 2015-09-09 NOTE — ED Notes (Signed)
MD at bedside. 

## 2016-02-28 ENCOUNTER — Emergency Department (HOSPITAL_COMMUNITY)
Admission: EM | Admit: 2016-02-28 | Discharge: 2016-02-28 | Disposition: A | Discharge status: Home / Self Care | Payer: Self-pay | Attending: Emergency Medicine | Admitting: Emergency Medicine

## 2016-02-28 ENCOUNTER — Encounter (HOSPITAL_COMMUNITY): Payer: Self-pay | Admitting: Emergency Medicine

## 2016-02-28 DIAGNOSIS — F17210 Nicotine dependence, cigarettes, uncomplicated: Secondary | ICD-10-CM | POA: Insufficient documentation

## 2016-02-28 DIAGNOSIS — K029 Dental caries, unspecified: Secondary | ICD-10-CM

## 2016-02-28 DIAGNOSIS — I10 Essential (primary) hypertension: Secondary | ICD-10-CM | POA: Insufficient documentation

## 2016-02-28 MED ORDER — CLINDAMYCIN HCL 150 MG PO CAPS
300.0000 mg | ORAL_CAPSULE | Freq: Once | ORAL | Status: AC
  Administered 2016-02-28: 300 mg via ORAL
  Filled 2016-02-28: qty 2

## 2016-02-28 MED ORDER — CLINDAMYCIN HCL 150 MG PO CAPS: 150.0000 mg | ORAL_CAPSULE | Freq: Four times a day (QID) | ORAL | 0 refills | Status: DC

## 2016-02-28 MED ORDER — IBUPROFEN 600 MG PO TABS: 600.0000 mg | ORAL_TABLET | Freq: Four times a day (QID) | ORAL | 0 refills | Status: DC | PRN

## 2016-02-28 MED ORDER — ONDANSETRON HCL 4 MG PO TABS
4.0000 mg | ORAL_TABLET | Freq: Once | ORAL | Status: AC
  Administered 2016-02-28: 4 mg via ORAL
  Filled 2016-02-28: qty 1

## 2016-02-28 MED ORDER — IBUPROFEN 800 MG PO TABS
800.0000 mg | ORAL_TABLET | Freq: Once | ORAL | Status: AC
  Administered 2016-02-28: 800 mg via ORAL
  Filled 2016-02-28: qty 1

## 2016-02-28 NOTE — ED Provider Notes (Signed)
[facility]-EMERGENCY DEPT Provider Note   CSN: [id number] Arrival date & time: 02/28/16  1311  By signing my name below, I, Jeremy Nichols, attest that this documentation has been prepared under the direction and in the presence of Jeremy Nichols Billee Balcerzak, PA-C.  Electronically Signed: Rosario AdieWilliam Andrew Nichols, ED Scribe. 02/28/16. Marland Kitchen.  History   Chief Complaint Chief Complaint  Patient presents with  . Dental Pain   The history is provided by the patient. No language interpreter was used.  Dental Pain   This is a new problem. The current episode started more than 2 days ago. The problem occurs constantly. The problem has been gradually worsening. The pain is at a severity of 8/10. He has tried nothing for the symptoms. The treatment provided no relief.    HPI Comments: Jeremy Nichols is a 35 y.o. male with a PMHx of HTN, who presents to the Emergency Department complaining of gradually worsening, constant right-sided lower dental pain onset approximately 5 days ago. He reports associated right-sided, lower facial swelling and chills secondary to his dental pain. His pain is exacerbated with chewing. No treatments for his pain were tried prior to coming into the ED. He is not currently followed by a dental specialist. No h/o DM. He denies fever, nausea, vomiting, or any other associated symptoms.   Past Medical History:  Diagnosis Date  . Hypertension   . Kidney stones   . Pain, dental   . Peptic ulcer    Patient Active Problem List   Diagnosis Date Noted  . SPRAIN&STRAIN OTHER SPECIFIED SITES KNEE&LEG 10/27/2009   History reviewed. No pertinent surgical history.  Home Medications    Prior to Admission medications   Medication Sig Start Date End Date Taking? Authorizing Provider  HYDROcodone-acetaminophen (NORCO/VICODIN) 5-325 MG tablet Take 2 tablets by mouth every 4 (four) hours as needed for moderate pain. Patient not taking: Reported on 09/09/2015 04/05/15   Jeremy HongBrian Miller, MD  ibuprofen  (ADVIL,MOTRIN) 200 MG tablet Take 800 mg by mouth every 6 (six) hours as needed for moderate pain.     Historical Provider, MD  lisinopril (PRINIVIL,ZESTRIL) 10 MG tablet Take 1 tablet (10 mg total) by mouth daily. Patient not taking: Reported on 09/09/2015 12/17/14   Burgess AmorJulie Idol, PA-C  methocarbamol (ROBAXIN) 500 MG tablet Take 2 tablets (1,000 mg total) by mouth 4 (four) times daily as needed for muscle spasms (muscle spasm/pain). Patient not taking: Reported on 09/09/2015 04/03/15   Samuel JesterKathleen McManus, DO  naproxen (NAPROSYN) 500 MG tablet Take 1 po BID with food prn pain 09/09/15   Linwood DibblesJon Knapp, MD  penicillin v potassium (VEETID) 500 MG tablet Take 1 tablet (500 mg total) by mouth 4 (four) times daily. Patient not taking: Reported on 09/09/2015 06/03/15   Devoria AlbeIva Knapp, MD  promethazine (PHENERGAN) 25 MG tablet Take 1 tablet (25 mg total) by mouth every 6 (six) hours as needed for nausea or vomiting. 09/09/15   Linwood DibblesJon Knapp, MD  ranitidine (ZANTAC) 75 MG tablet Take 75 mg by mouth daily as needed for heartburn.     Historical Provider, MD  tamsulosin (FLOMAX) 0.4 MG CAPS capsule Take 1 capsule (0.4 mg total) by mouth 2 (two) times daily. Patient not taking: Reported on 09/09/2015 04/05/15   Jeremy HongBrian Miller, MD   Family History Family History  Problem Relation Age of Onset  . Hypertension Father   . Diabetes Other   . Hypertension Other    Social History Social History  Substance Use Topics  . Smoking  status: Current Every Day Smoker    Packs/day: 1.00    Types: Cigarettes  . Smokeless tobacco: Never Used  . Alcohol use 3.6 oz/week    6 Shots of liquor per week     Comment: occasional   Allergies   Tramadol hcl  Review of Systems Review of Systems  Constitutional: Positive for chills. Negative for fever.  HENT: Positive for dental problem (right-sided, lower) and facial swelling (right sided, lower).   Gastrointestinal: Negative for nausea and vomiting.  All other systems reviewed and are  negative.  Physical Exam Updated Vital Signs BP (!) 146/109 (BP Location: Left Arm) Comment: Repeat  Pulse 100   Temp 98.5 F (36.9 C) (Oral)   Resp 18   Ht 6' (1.829 m)   Wt 210 lb (95.3 kg)   SpO2 99%   BMI 28.48 kg/m   Physical Exam  Constitutional: He appears well-developed and well-nourished. No distress.  HENT:  Head: Normocephalic and atraumatic.  There are no temperature changes of the face on right versus the left. Mild swelling of the right lower face. There is a submental lymph node enlarged on the right side. The first, second, and third molar on the right, lower jaw are decayed into the gumline. There is gingival swelling to the area. There is no visible abscess. There is no swelling under the tongue. Airway is patent.   Eyes: Conjunctivae are normal.  Neck: Normal range of motion.  Cardiovascular: Normal rate, regular rhythm and normal heart sounds.  Exam reveals no gallop and no friction rub.   No murmur heard. Pulmonary/Chest: Effort normal and breath sounds normal. No respiratory distress. He has no wheezes. He has no rales.  Abdominal: He exhibits no distension.  Musculoskeletal: Normal range of motion.  Neurological: He is alert.  Skin: No pallor.  Psychiatric: He has a normal mood and affect. His behavior is normal.  Nursing note and vitals reviewed.  ED Treatments / Results  DIAGNOSTIC STUDIES: Oxygen Saturation is 99% on RA, normal by my interpretation.   COORDINATION OF CARE: 2:03 PM-Discussed next steps with pt. Pt verbalized understanding and is agreeable with the plan.   Procedures Procedures   Medications Ordered in ED Medications - No data to display  Initial Impression / Assessment and Plan / ED Course  I have reviewed the triage vital signs and the nursing notes.  Clinical Course     MDM No visible abscess. No evidence for Ludwig's angina. Airway is patent. We'll rx Clindamycin and Ibuprofen. Advised for f/u w/ a dental specialist as  soon as possible.   Final Clinical Impressions(s) / ED Diagnoses   Final diagnoses:  Dental caries   New Prescriptions New Prescriptions   No medications on file   **I personally performed the services described in this documentation, which was scribed in my presence. The recorded information has been reviewed and is accurate.Jeremy Quale*    Evin Loiseau, PA-C 02/28/16 1433    Donnetta HutchingBrian Cook, MD 02/29/16 [phone]

## 2016-02-28 NOTE — ED Triage Notes (Signed)
Pt reports dental pain that started last Thursday.

## 2016-02-28 NOTE — Discharge Instructions (Signed)
It is important that you see a dentist as soon as possible. Use clindamycin and ibuprofen daily until all taken.

## 2016-06-21 ENCOUNTER — Encounter (HOSPITAL_COMMUNITY): Payer: Self-pay | Admitting: *Deleted

## 2016-06-21 ENCOUNTER — Emergency Department (HOSPITAL_COMMUNITY): Payer: Self-pay

## 2016-06-21 ENCOUNTER — Emergency Department (HOSPITAL_COMMUNITY)
Admission: EM | Admit: 2016-06-21 | Discharge: 2016-06-21 | Disposition: A | Discharge status: Home / Self Care | Payer: Self-pay | Attending: Emergency Medicine | Admitting: Emergency Medicine

## 2016-06-21 DIAGNOSIS — M62838 Other muscle spasm: Secondary | ICD-10-CM

## 2016-06-21 DIAGNOSIS — I10 Essential (primary) hypertension: Secondary | ICD-10-CM | POA: Insufficient documentation

## 2016-06-21 DIAGNOSIS — S161XXA Strain of muscle, fascia and tendon at neck level, initial encounter: Secondary | ICD-10-CM | POA: Insufficient documentation

## 2016-06-21 DIAGNOSIS — Y929 Unspecified place or not applicable: Secondary | ICD-10-CM | POA: Insufficient documentation

## 2016-06-21 DIAGNOSIS — W228XXA Striking against or struck by other objects, initial encounter: Secondary | ICD-10-CM | POA: Insufficient documentation

## 2016-06-21 DIAGNOSIS — F17210 Nicotine dependence, cigarettes, uncomplicated: Secondary | ICD-10-CM | POA: Insufficient documentation

## 2016-06-21 DIAGNOSIS — Y990 Civilian activity done for income or pay: Secondary | ICD-10-CM | POA: Insufficient documentation

## 2016-06-21 DIAGNOSIS — Y9389 Activity, other specified: Secondary | ICD-10-CM | POA: Insufficient documentation

## 2016-06-21 DIAGNOSIS — Z79899 Other long term (current) drug therapy: Secondary | ICD-10-CM | POA: Insufficient documentation

## 2016-06-21 MED ORDER — HYDROCODONE-ACETAMINOPHEN 5-325 MG PO TABS: ORAL_TABLET | ORAL | 0 refills | Status: DC

## 2016-06-21 MED ORDER — NAPROXEN 500 MG PO TABS: 500.0000 mg | ORAL_TABLET | Freq: Two times a day (BID) | ORAL | 0 refills | Status: DC

## 2016-06-21 MED ORDER — CYCLOBENZAPRINE HCL 10 MG PO TABS: 10.0000 mg | ORAL_TABLET | Freq: Three times a day (TID) | ORAL | 0 refills | Status: DC | PRN

## 2016-06-21 NOTE — Discharge Instructions (Signed)
Alternate ice and heat to your neck and upper back.  Call one of the providers listed to arrange follow-up in one week if not improving

## 2016-06-21 NOTE — ED Triage Notes (Signed)
Pt was working 1 week ago when he was moving to a standing position and hit the top of his head on a mental bar at work. Pt denies any loc. Pt now having neck pain and pain between his shoulder blade.

## 2016-06-25 NOTE — ED Provider Notes (Signed)
[facility]PT Provider Note   CSN: [id number] Arrival date & time: 06/21/16  1016     History   Chief Complaint Chief Complaint  Patient presents with  . Neck Pain    HPI CHRISTERPHER CLOS is a 36 y.o. male.  HPI   YOUNES DEGEORGE is a 37 y.o. male who presents to the Emergency Department complaining of neck pain for one week.  He reports a metal bar struck the top of his head and he has been experincing neck pain with movement and rotation of his neck.  Pain radiates to the area between his shoulder blades.  He denies LOC, headaches, dizziness, vomiting, pain, numbness or weakness of the upper extremities.   Past Medical History:  Diagnosis Date  . Hypertension   . Kidney stones   . Pain, dental   . Peptic ulcer     Patient Active Problem List   Diagnosis Date Noted  . SPRAIN&STRAIN OTHER SPECIFIED SITES KNEE&LEG 10/27/2009    History reviewed. No pertinent surgical history.     Home Medications    Prior to Admission medications   Medication Sig Start Date End Date Taking? Authorizing Provider  clindamycin (CLEOCIN) 150 MG capsule Take 1 capsule (150 mg total) by mouth every 6 (six) hours. 02/28/16   Ivery Quale, PA-C  cyclobenzaprine (FLEXERIL) 10 MG tablet Take 1 tablet (10 mg total) by mouth 3 (three) times daily as needed. 06/21/16   Zyan Mirkin, PA-C  HYDROcodone-acetaminophen (NORCO/VICODIN) 5-325 MG tablet Take one-two tabs po q 4-6 hrs prn pain 06/21/16   Klair Leising, PA-C  ibuprofen (ADVIL,MOTRIN) 600 MG tablet Take 1 tablet (600 mg total) by mouth every 6 (six) hours as needed. 02/28/16   Ivery Quale, PA-C  naproxen (NAPROSYN) 500 MG tablet Take 1 tablet (500 mg total) by mouth 2 (two) times daily with a meal. 06/21/16   Maiya Kates, PA-C  promethazine (PHENERGAN) 25 MG tablet Take 1 tablet (25 mg total) by mouth every 6 (six) hours as needed for nausea or vomiting. Patient not taking: Reported on 02/28/2016 09/09/15   Linwood Dibbles, MD  ranitidine  (ZANTAC) 75 MG tablet Take 75 mg by mouth daily as needed for heartburn.     Historical Provider, MD    Family History Family History  Problem Relation Age of Onset  . Hypertension Father   . Diabetes Other   . Hypertension Other     Social History Social History  Substance Use Topics  . Smoking status: Current Every Day Smoker    Packs/day: 1.00    Types: Cigarettes  . Smokeless tobacco: Never Used  . Alcohol use 3.6 oz/week    6 Shots of liquor per week     Comment: occasional     Allergies   Tramadol hcl   Review of Systems Review of Systems  Constitutional: Negative for chills and fever.  Eyes: Negative for visual disturbance.  Musculoskeletal: Positive for arthralgias and neck pain. Negative for joint swelling.  Skin: Negative for color change and wound.  Neurological: Negative for dizziness, syncope, weakness, numbness and headaches.  Psychiatric/Behavioral: Negative for confusion.  All other systems reviewed and are negative.    Physical Exam Updated Vital Signs BP (!) 154/97 (BP Location: Right Arm)   Pulse (!) 105   Temp 97.6 F (36.4 C) (Oral)   Resp 18   Ht 6' (1.829 m)   Wt 95.3 kg   SpO2 98%   BMI 28.48 kg/m   Physical Exam  Constitutional: He is oriented to person, place, and time. He appears well-developed and well-nourished. No distress.  HENT:  Head: Normocephalic and atraumatic.  Mouth/Throat: Oropharynx is clear and moist.  Eyes: EOM are normal. Pupils are equal, round, and reactive to light.  Neck: Phonation normal. Muscular tenderness present. No spinous process tenderness present. No neck rigidity. Decreased range of motion present. No erythema present. No Brudzinski's sign and no Kernig's sign noted. No thyromegaly present.  Cardiovascular: Normal rate, regular rhythm and intact distal pulses.   No murmur heard. Pulmonary/Chest: Effort normal and breath sounds normal. No respiratory distress. He exhibits no tenderness.    Musculoskeletal: He exhibits tenderness. He exhibits no edema.       Cervical back: He exhibits tenderness and spasm. He exhibits normal range of motion, no bony tenderness, no swelling, no deformity and normal pulse.  ttp of the cervical spine and bilateral paraspinal muscles and along the bilateral trapezius muscles.  Grip strength is strong and equal bilaterally.  Distal sensation intact  Lymphadenopathy:    He has no cervical adenopathy.  Neurological: He is alert and oriented to person, place, and time. He has normal strength. No sensory deficit. He exhibits normal muscle tone. Coordination normal.  Reflex Scores:      Tricep reflexes are 2+ on the right side and 2+ on the left side.      Bicep reflexes are 2+ on the right side and 2+ on the left side. Skin: Skin is warm and dry.  Nursing note and vitals reviewed.    ED Treatments / Results  Labs (all labs ordered are listed, but only abnormal results are displayed) Labs Reviewed - No data to display  EKG  EKG Interpretation None       Radiology Dg Cervical Spine Complete  Result Date: 06/21/2016 CLINICAL DATA:  Injury 1 week prior.  Neck pain. EXAM: CERVICAL SPINE - COMPLETE 4+ VIEW COMPARISON:  None. FINDINGS: On the lateral view the cervical spine is visualized to the level of C7-T1. Straightening of the cervical spine. Pre-vertebral soft tissues are within normal limits. No fracture is detected in the cervical spine. Dens is well positioned between the lateral masses of C1. Cervical disc heights are preserved, with no appreciable spondylosis. No cervical spine subluxation. No significant facet arthropathy. No appreciable bony foraminal stenosis. No aggressive-appearing focal osseous lesions. IMPRESSION: No cervical spine fracture or subluxation. Electronically Signed   By: Delbert Phenix M.D.   On: 06/21/2016 11:45   Dg Thoracic Spine 2 View  Result Date: 06/21/2016 CLINICAL DATA:  Dorsalgia EXAM: THORACIC SPINE 3 VIEWS  COMPARISON:  July 03, 2014 FINDINGS: Frontal, lateral, and swimmer's views were obtained. There is slight upper thoracic levoscoliosis. There is no fracture for spondylolisthesis. The disc spaces appear normal. No erosive change or paraspinous lesion. IMPRESSION: Slight scoliosis. No fracture or spondylolisthesis. No appreciable arthropathic change. Electronically Signed   By: Bretta Bang III M.D.   On: 06/21/2016 11:44     Procedures Procedures (including critical care time)  Medications Ordered in ED Medications - No data to display   Initial Impression / Assessment and Plan / ED Course  I have reviewed the triage vital signs and the nursing notes.  Pertinent labs & imaging results that were available during my care of the patient were reviewed by me and considered in my medical decision making (see chart for details).     Pt with likely cervical strain.  No focal neuro deficits.  No concerning sx's for  emergent neurological process.  Return precautions discussed.   Final Clinical Impressions(s) / ED Diagnoses   Final diagnoses:  Acute strain of neck muscle, initial encounter  Muscle spasms of neck    New Prescriptions Discharge Medication List as of 06/21/2016 12:13 PM    START taking these medications   Details  cyclobenzaprine (FLEXERIL) 10 MG tablet Take 1 tablet (10 mg total) by mouth 3 (three) times daily as needed., Starting Wed 06/21/2016, Print    HYDROcodone-acetaminophen (NORCO/VICODIN) 5-325 MG tablet Take one-two tabs po q 4-6 hrs prn pain, Print    naproxen (NAPROSYN) 500 MG tablet Take 1 tablet (500 mg total) by mouth 2 (two) times daily with a meal., Starting Wed 06/21/2016, Print         Nieve Rojero [place], PA-C 06/25/16 2300    Bethann Berkshire, MD 06/25/16 [phone]

## 2016-08-29 ENCOUNTER — Emergency Department (HOSPITAL_COMMUNITY): Payer: Self-pay

## 2016-08-29 ENCOUNTER — Encounter (HOSPITAL_COMMUNITY): Payer: Self-pay

## 2016-08-29 ENCOUNTER — Emergency Department (HOSPITAL_COMMUNITY)
Admission: EM | Admit: 2016-08-29 | Discharge: 2016-08-29 | Disposition: A | Discharge status: Home / Self Care | Payer: Self-pay | Attending: Emergency Medicine | Admitting: Emergency Medicine

## 2016-08-29 DIAGNOSIS — S46912A Strain of unspecified muscle, fascia and tendon at shoulder and upper arm level, left arm, initial encounter: Secondary | ICD-10-CM | POA: Insufficient documentation

## 2016-08-29 DIAGNOSIS — X58XXXA Exposure to other specified factors, initial encounter: Secondary | ICD-10-CM | POA: Insufficient documentation

## 2016-08-29 DIAGNOSIS — I10 Essential (primary) hypertension: Secondary | ICD-10-CM | POA: Insufficient documentation

## 2016-08-29 DIAGNOSIS — Y939 Activity, unspecified: Secondary | ICD-10-CM | POA: Insufficient documentation

## 2016-08-29 DIAGNOSIS — Y929 Unspecified place or not applicable: Secondary | ICD-10-CM | POA: Insufficient documentation

## 2016-08-29 DIAGNOSIS — F17210 Nicotine dependence, cigarettes, uncomplicated: Secondary | ICD-10-CM | POA: Insufficient documentation

## 2016-08-29 DIAGNOSIS — Y999 Unspecified external cause status: Secondary | ICD-10-CM | POA: Insufficient documentation

## 2016-08-29 MED ORDER — IBUPROFEN 600 MG PO TABS: 600.0000 mg | ORAL_TABLET | Freq: Four times a day (QID) | ORAL | 0 refills | Status: DC

## 2016-08-29 MED ORDER — CYCLOBENZAPRINE HCL 10 MG PO TABS: 10.0000 mg | ORAL_TABLET | Freq: Three times a day (TID) | ORAL | 0 refills | Status: DC

## 2016-08-29 MED ORDER — KETOROLAC TROMETHAMINE 30 MG/ML IJ SOLN
30.0000 mg | Freq: Once | INTRAMUSCULAR | Status: AC
  Administered 2016-08-29: 30 mg via INTRAVENOUS
  Filled 2016-08-29: qty 1

## 2016-08-29 MED ORDER — ONDANSETRON HCL 4 MG PO TABS
4.0000 mg | ORAL_TABLET | Freq: Once | ORAL | Status: AC
  Administered 2016-08-29: 4 mg via ORAL
  Filled 2016-08-29: qty 1

## 2016-08-29 MED ORDER — DEXAMETHASONE 4 MG PO TABS: 4.0000 mg | ORAL_TABLET | Freq: Two times a day (BID) | ORAL | 0 refills | Status: DC

## 2016-08-29 MED ORDER — DEXAMETHASONE SODIUM PHOSPHATE 4 MG/ML IJ SOLN
8.0000 mg | Freq: Once | INTRAMUSCULAR | Status: AC
  Administered 2016-08-29: 8 mg via INTRAMUSCULAR
  Filled 2016-08-29: qty 2

## 2016-08-29 MED ORDER — CYCLOBENZAPRINE HCL 10 MG PO TABS
10.0000 mg | ORAL_TABLET | Freq: Once | ORAL | Status: AC
  Administered 2016-08-29: 10 mg via ORAL
  Filled 2016-08-29: qty 1

## 2016-08-29 NOTE — Discharge Instructions (Signed)
Your blood pressure slightly elevated at 153/94, otherwise your vital signs are essentially within normal limits. The x-ray of your left shoulder is negative for fracture, dislocation, or joint effusion. Your examination suggest possible acute on chronic rotator cuff injury. Please use your sling. Please use Decadron and ibuprofen daily with food. Use Flexeril 3 times daily if possible. It is important however that you DO NOT  use this medication if you are drinking alcohol, driving a vehicle, operating machinery, or participating in activities requiring concentration. Please see Dr. Romeo AppleHarrison as sone as possible for evaluation of your shoulder.

## 2016-08-29 NOTE — ED Triage Notes (Signed)
Patient states that his left shoulder started bothering him a couple of months ago and has been worsening since.  States that he can not lift it much at all.  No known injury.

## 2016-08-29 NOTE — ED Provider Notes (Signed)
[facility] Provider Note   CSN: [id number] Arrival date & time: 08/29/16  2053     History   Chief Complaint Chief Complaint  Patient presents with  . Shoulder Pain    HPI Jeremy Nichols is a 36 y.o. male.  Patient is a 36 year old male who presents to the emergency department with complaint of left shoulder pain.  The patient states that he's been having problems with the shoulder off and on for quite some time. He was told several months ago that he should be seen by orthopedic specialist. He states he has not had the finances to go see the specialist. 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 In the last week the left shoulder has been bothering him significantly. He says that at times when he attempts to lift something he has to drop it because it hurts so severely. He also feels that he's noticing any weakness in his left shoulder. He presents now because he says the pain is getting worse and worse and he is concerned about the use of his shoulder.      Past Medical History:  Diagnosis Date  . Hypertension   . Kidney stones   . Pain, dental   . Peptic ulcer     Patient Active Problem List   Diagnosis Date Noted  . SPRAIN&STRAIN OTHER SPECIFIED SITES KNEE&LEG 10/27/2009    History reviewed. No pertinent surgical history.     Home Medications    Prior to Admission medications   Medication Sig Start Date End Date Taking? Authorizing Provider  clindamycin (CLEOCIN) 150 MG capsule Take 1 capsule (150 mg total) by mouth every 6 (six) hours. 02/28/16   Ivery QualeBryant, Bahja Bence, PA-C  cyclobenzaprine (FLEXERIL) 10 MG tablet Take 1 tablet (10 mg total) by mouth 3 (three) times daily as needed. 06/21/16   Triplett, Tammy, PA-C  HYDROcodone-acetaminophen (NORCO/VICODIN) 5-325 MG tablet Take one-two tabs po q 4-6 hrs prn pain 06/21/16   Triplett, Tammy, PA-C  ibuprofen (ADVIL,MOTRIN)  600 MG tablet Take 1 tablet (600 mg total) by mouth every 6 (six) hours as needed. 02/28/16   Ivery QualeBryant, Maggi Hershkowitz, PA-C  naproxen (NAPROSYN) 500 MG tablet Take 1 tablet (500 mg total) by mouth 2 (two) times daily with a meal. 06/21/16   Triplett, Tammy, PA-C  promethazine (PHENERGAN) 25 MG tablet Take 1 tablet (25 mg total) by mouth every 6 (six) hours as needed for nausea or vomiting. Patient not taking: Reported on 02/28/2016 09/09/15   Linwood DibblesKnapp, Jon, MD  ranitidine (ZANTAC) 75 MG tablet Take 75 mg by mouth daily as needed for heartburn.     [provider]    Family History Family History  Problem Relation Age of Onset  . Hypertension Father   . Diabetes Other   . Hypertension Other     Social History Social History  Substance Use Topics  . Smoking status: Current Every Day Smoker    Packs/day: 1.00    Types: Cigarettes  . Smokeless tobacco: Never Used  . Alcohol use 3.6 oz/week    6 Shots of liquor per week     Comment: occasional     Allergies   Tramadol hcl   Review of Systems Review of Systems  Constitutional: Negative for activity change and appetite change.  HENT: Negative for congestion, ear discharge, ear pain, facial swelling, nosebleeds, rhinorrhea, sneezing and tinnitus.   Eyes: Negative for  photophobia, pain and discharge.  Respiratory: Negative for cough, choking, shortness of breath and wheezing.   Cardiovascular: Negative for chest pain, palpitations and leg swelling.  Gastrointestinal: Negative for abdominal pain, blood in stool, constipation, diarrhea, nausea and vomiting.  Genitourinary: Negative for difficulty urinating, dysuria, flank pain, frequency and hematuria.  Musculoskeletal: Positive for arthralgias. Negative for back pain, gait problem, myalgias and neck pain.       Shoulder pain  Skin: Negative for color change, rash and wound.  Neurological: Negative for dizziness, seizures, syncope, facial asymmetry, speech difficulty, weakness and  numbness.  Hematological: Negative for adenopathy. Does not bruise/bleed easily.  Psychiatric/Behavioral: Negative for agitation, confusion, hallucinations, self-injury and suicidal ideas. The patient is not nervous/anxious.      Physical Exam Updated Vital Signs BP (!) 153/94 (BP Location: Right Arm)   Pulse (!) 103   Temp 97.9 F (36.6 C) (Oral)   Ht 6' (1.829 m)   Wt 95.3 kg (210 lb)   SpO2 98%   BMI 28.48 kg/m   Physical Exam  Constitutional: Vital signs are normal. He appears well-developed and well-nourished. He is active.  HENT:  Head: Normocephalic and atraumatic.  Right Ear: Tympanic membrane, external ear and ear canal normal.  Left Ear: Tympanic membrane, external ear and ear canal normal.  Nose: Nose normal.  Mouth/Throat: Uvula is midline, oropharynx is clear and moist and mucous membranes are normal.  Eyes: Conjunctivae, EOM and lids are normal. Pupils are equal, round, and reactive to light.  Neck: Trachea normal, normal range of motion and phonation normal. Neck supple. Carotid bruit is not present.  Cardiovascular: Normal rate, regular rhythm and normal pulses.   Abdominal: Soft. Normal appearance and bowel sounds are normal.  Musculoskeletal:       Left shoulder: He exhibits decreased range of motion, tenderness, crepitus and pain.  Lymphadenopathy:       Head (right side): No submental, no preauricular and no posterior auricular adenopathy present.       Head (left side): No submental, no preauricular and no posterior auricular adenopathy present.    He has no cervical adenopathy.  Neurological: He is alert. He has normal strength. No cranial nerve deficit or sensory deficit. GCS eye subscore is 4. GCS verbal subscore is 5. GCS motor subscore is 6.  Skin: Skin is warm and dry.  Psychiatric: His speech is normal.     ED Treatments / Results  Labs (all labs ordered are listed, but only abnormal results are displayed) Labs Reviewed - No data to  display  EKG  EKG Interpretation None       Radiology Dg Shoulder Left  Result Date: 08/29/2016 CLINICAL DATA:  36 year old with chronic generalized left shoulder pain that has acutely worsened over the past 3 weeks. No known injuries. EXAM: LEFT SHOULDER - 2+ VIEW COMPARISON:  None. FINDINGS: No evidence of acute fracture or glenohumeral dislocation. Subacromial space well preserved. Acromioclavicular joint intact. Well preserved bone mineral density. No intrinsic osseous abnormality. IMPRESSION: Normal examination. Electronically Signed   By: Hulan Saas M.D.   On: 08/29/2016 21:44    Procedures Procedures (including critical care time)  Medications Ordered in ED Medications  dexamethasone (DECADRON) injection 8 mg (not administered)  ketorolac (TORADOL) 30 MG/ML injection 30 mg (not administered)  ondansetron (ZOFRAN) tablet 4 mg (not administered)  cyclobenzaprine (FLEXERIL) tablet 10 mg (not administered)     Initial Impression / Assessment and Plan / ED Course  I have reviewed the triage vital signs  and the nursing notes.  Pertinent labs & imaging results that were available during my care of the patient were reviewed by me and considered in my medical decision making (see chart for details).       Final Clinical Impressions(s) / ED Diagnoses MDM Vital signs reviewed. The x-ray of the left shoulder is negative for fracture or dislocation. The before meals joint is intact. The examination favors possible rotator cuff injury. There no neurovascular deficits appreciated of the right or the left upper extremity.  The patient is fitted with a sling. He is referred to orthopedics. I've given him an injection of Decadron and Toradol as well as a Flexeril tablet. We discussed the importance of him seeing an orthopedic specialist as this is been going on for some time and seems to be getting worse. The patient knowledge is understanding of the discharge instructions.     Final diagnoses:  None    New Prescriptions New Prescriptions   No medications on file     Duayne Cal 08/29/16 2251    Bethann Berkshire, MD 08/30/16 [phone]

## 2016-09-04 ENCOUNTER — Encounter: Payer: Self-pay | Admitting: Orthopedic Surgery

## 2016-09-04 ENCOUNTER — Ambulatory Visit: Payer: Self-pay | Admitting: Orthopedic Surgery

## 2016-09-11 ENCOUNTER — Ambulatory Visit: Payer: Self-pay | Admitting: Orthopedic Surgery

## 2016-10-23 ENCOUNTER — Encounter (HOSPITAL_COMMUNITY): Payer: Self-pay | Admitting: Emergency Medicine

## 2016-10-23 ENCOUNTER — Emergency Department (HOSPITAL_COMMUNITY)
Admission: EM | Admit: 2016-10-23 | Discharge: 2016-10-23 | Disposition: A | Discharge status: Home / Self Care | Payer: Self-pay | Attending: Emergency Medicine | Admitting: Emergency Medicine

## 2016-10-23 DIAGNOSIS — K029 Dental caries, unspecified: Secondary | ICD-10-CM

## 2016-10-23 DIAGNOSIS — K0889 Other specified disorders of teeth and supporting structures: Secondary | ICD-10-CM | POA: Insufficient documentation

## 2016-10-23 DIAGNOSIS — I10 Essential (primary) hypertension: Secondary | ICD-10-CM | POA: Insufficient documentation

## 2016-10-23 DIAGNOSIS — F17210 Nicotine dependence, cigarettes, uncomplicated: Secondary | ICD-10-CM | POA: Insufficient documentation

## 2016-10-23 MED ORDER — PENICILLIN V POTASSIUM 250 MG PO TABS
500.0000 mg | ORAL_TABLET | Freq: Once | ORAL | Status: AC
  Administered 2016-10-23: 500 mg via ORAL
  Filled 2016-10-23: qty 2

## 2016-10-23 MED ORDER — ACETAMINOPHEN 500 MG PO TABS
1000.0000 mg | ORAL_TABLET | Freq: Once | ORAL | Status: AC
  Administered 2016-10-23: 1000 mg via ORAL
  Filled 2016-10-23: qty 2

## 2016-10-23 MED ORDER — PENICILLIN V POTASSIUM 500 MG PO TABS: 500.0000 mg | ORAL_TABLET | Freq: Three times a day (TID) | ORAL | 0 refills | Status: DC

## 2016-10-23 NOTE — Discharge Instructions (Signed)
Call any of the numbers on the resource guide to get a dentist to schedule the next available appointment. Take the penicillin as prescribed. Call the [facility] to get a primary care physician follow you for blood pressure and any other health problems. Your blood pressure should be rechecked within the next 2 or 3 weeks. Today's was elevated at 153/97. Ask your primary care physician to help you to stop smoking

## 2016-10-23 NOTE — ED Provider Notes (Signed)
[facility] Provider Note   CSN: [id number] Arrival date & time: 10/23/16  2116     History   Chief Complaint Chief Complaint  Patient presents with  . Dental Pain    HPI Steward DroneJoshua N Matarese is a 36 y.o. male.  HPI Complains of dental pain at left lower molars onset days ago. Pain not made better or worse by anything. Pain is constant. He is treated himself with ibuprofen with minimal relief. No fever No other associated symptom Past Medical History:  Diagnosis Date  . Hypertension   . Kidney stones   . Pain, dental   . Peptic ulcer     Patient Active Problem List   Diagnosis Date Noted  . SPRAIN&STRAIN OTHER SPECIFIED SITES KNEE&LEG 10/27/2009    History reviewed. No pertinent surgical history.     Home Medications    Prior to Admission medications   Medication Sig Start Date End Date Taking? Authorizing Provider  ibuprofen (ADVIL,MOTRIN) 200 MG tablet Take 800 mg by mouth every 6 (six) hours as needed for mild pain or moderate pain.   Yes [provider]  ranitidine (ZANTAC) 75 MG tablet Take 75 mg by mouth daily as needed for heartburn.    Yes [provider]  ibuprofen (ADVIL,MOTRIN) 600 MG tablet Take 1 tablet (600 mg total) by mouth 4 (four) times daily. Patient not taking: Reported on 10/23/2016 08/29/16   Ivery QualeBryant, [place], PA-C    Family History Family History  Problem Relation Age of Onset  . Hypertension Father   . Diabetes Other   . Hypertension Other     Social History Social History  Substance Use Topics  . Smoking status: Current Every Day Smoker    Packs/day: 1.00    Types: Cigarettes  . Smokeless tobacco: Never Used  . Alcohol use 3.6 oz/week    6 Shots of liquor per week     Comment: occasional     Allergies   Tramadol hcl   Review of Systems Review of Systems  Constitutional: Negative.   HENT: Positive for dental problem.      Physical Exam Updated Vital Signs BP (!) 153/97 (BP Location: Right Arm)    Pulse 94   Temp 98.3 F (36.8 C) (Oral)   Resp 17   Ht 5\' 8"  (1.727 m)   Wt 86.2 kg (190 lb)   SpO2 96%   BMI 28.89 kg/m   Physical Exam  Constitutional: He is oriented to person, place, and time. He appears well-developed and well-nourished. No distress.  HENT:  Head: Normocephalic and atraumatic.  Right Ear: External ear normal.  Left Ear: External ear normal.  Mouth/Throat: Oropharynx is clear and moist.  Widespread dental decay including teeth #17, 18,19 and 20 and multiple upper teeth no trismus and no tenderness with sub-mandibular area  Eyes: EOM are normal.  Neck: Normal range of motion.  Cardiovascular: Normal rate.   Pulmonary/Chest: Effort normal.  Abdominal: He exhibits no distension.  Musculoskeletal: Normal range of motion.  Lymphadenopathy:    He has no cervical adenopathy.  Neurological: He is alert and oriented to person, place, and time. No cranial nerve deficit.  Skin: Skin is warm and dry. No rash noted.  Psychiatric: He has a normal mood and affect.  Nursing note and vitals reviewed.    ED Treatments / Results  Labs (all labs ordered are listed, but only abnormal results are displayed) Labs Reviewed - No data to display  EKG  EKG Interpretation None  Radiology No results found.  Procedures Procedures (including critical care time)  Medications Ordered in ED Medications - No data to display   Initial Impression / Assessment and Plan / ED Course  I have reviewed the triage vital signs and the nursing notes.  Pertinent labs & imaging results that were available during my care of the patient were reviewed by me and considered in my medical decision making (see chart for details).    And Tylenol, prescription Pen-Vee K. Blood pressure recheck. Referral surrogate and clinic referral resource guide for dental Final Clinical Impressions(s) / ED Diagnoses  Diagnosis #1 dental pain #2 elevated blood pressure #3 tobacco abuse Final  diagnoses:  None    New Prescriptions New Prescriptions   No medications on file     Doug Sou, MD 10/23/16 2305

## 2016-10-23 NOTE — ED Triage Notes (Signed)
Pt c/o left lower dental pain since Thursday.

## 2016-12-19 ENCOUNTER — Emergency Department (HOSPITAL_COMMUNITY)
Admission: EM | Admit: 2016-12-19 | Discharge: 2016-12-20 | Disposition: A | Discharge status: Home / Self Care | Payer: Self-pay | Attending: Emergency Medicine | Admitting: Emergency Medicine

## 2016-12-19 ENCOUNTER — Encounter (HOSPITAL_COMMUNITY): Payer: Self-pay | Admitting: *Deleted

## 2016-12-19 DIAGNOSIS — F17210 Nicotine dependence, cigarettes, uncomplicated: Secondary | ICD-10-CM | POA: Insufficient documentation

## 2016-12-19 DIAGNOSIS — R1031 Right lower quadrant pain: Secondary | ICD-10-CM | POA: Insufficient documentation

## 2016-12-19 DIAGNOSIS — I10 Essential (primary) hypertension: Secondary | ICD-10-CM | POA: Insufficient documentation

## 2016-12-19 DIAGNOSIS — R109 Unspecified abdominal pain: Secondary | ICD-10-CM

## 2016-12-19 LAB — URINALYSIS, ROUTINE W REFLEX MICROSCOPIC
BILIRUBIN URINE: NEGATIVE
GLUCOSE, UA: NEGATIVE mg/dL
Hgb urine dipstick: NEGATIVE
Ketones, ur: NEGATIVE mg/dL
Leukocytes, UA: NEGATIVE
Nitrite: NEGATIVE
PH: 6 (ref 5.0–8.0)
Protein, ur: NEGATIVE mg/dL
SPECIFIC GRAVITY, URINE: 1.018 (ref 1.005–1.030)

## 2016-12-19 MED ORDER — KETOROLAC TROMETHAMINE 30 MG/ML IJ SOLN
30.0000 mg | Freq: Once | INTRAMUSCULAR | Status: AC
  Administered 2016-12-19: 30 mg via INTRAVENOUS
  Filled 2016-12-19: qty 1

## 2016-12-19 MED ORDER — ONDANSETRON HCL 4 MG/2ML IJ SOLN
4.0000 mg | Freq: Once | INTRAMUSCULAR | Status: AC
  Administered 2016-12-19: 4 mg via INTRAVENOUS
  Filled 2016-12-19: qty 2

## 2016-12-19 NOTE — ED Provider Notes (Signed)
[facility]-EMERGENCY DEPT Provider Note   CSN: [id number] Arrival date & time: 12/19/16  2231     History   Chief Complaint Chief Complaint  Patient presents with  . Flank Pain    HPI RIHAAN BARRACK is a 36 y.o. male.  The history is provided by the patient.  Flank Pain  This is a new problem. The current episode started more than 2 days ago. The problem occurs constantly. The problem has been gradually worsening. Associated symptoms include abdominal pain and headaches. Pertinent negatives include no chest pain and no shortness of breath. The symptoms are aggravated by twisting. Nothing relieves the symptoms.   Pt presents with right flank pain that radiates into abdomen It started a week ago and is worsening No fever/vomiting This is similar to prior kidney stones He denies h/o urologic intervention No other acute complaints Past Medical History:  Diagnosis Date  . Hypertension   . Kidney stones   . Pain, dental   . Peptic ulcer     Patient Active Problem List   Diagnosis Date Noted  . SPRAIN&STRAIN OTHER SPECIFIED SITES KNEE&LEG 10/27/2009    History reviewed. No pertinent surgical history.     Home Medications    Prior to Admission medications   Not on File    Family History Family History  Problem Relation Age of Onset  . Hypertension Father   . Diabetes Other   . Hypertension Other     Social History Social History  Substance Use Topics  . Smoking status: Current Every Day Smoker    Packs/day: 1.00    Types: Cigarettes  . Smokeless tobacco: Never Used  . Alcohol use 3.6 oz/week    6 Shots of liquor per week     Comment: occasional     Allergies   Tramadol hcl   Review of Systems Review of Systems  Constitutional: Negative for fever.  Respiratory: Negative for shortness of breath.   Cardiovascular: Negative for chest pain.  Gastrointestinal: Positive for abdominal pain.  Genitourinary: Positive for flank pain and hematuria.    Neurological: Positive for headaches.  All other systems reviewed and are negative.    Physical Exam Updated Vital Signs BP (!) 152/107   Pulse 90   Temp 97.9 F (36.6 C)   Resp 20   Ht 1.829 m (6')   Wt 90.7 kg (200 lb)   SpO2 96%   BMI 27.12 kg/m   Physical Exam CONSTITUTIONAL: Well developed/well nourished HEAD: Normocephalic/atraumatic EYES: EOMI/PERRL ENMT: Mucous membranes moist, poor dentition NECK: supple no meningeal signs SPINE/BACK:entire spine nontender CV: S1/S2 noted, no murmurs/rubs/gallops noted LUNGS: Lungs are clear to auscultation bilaterally, no apparent distress ABDOMEN: soft, nontender, no rebound or guarding, bowel sounds noted throughout abdomen WU:[id number] cva tenderness, no lower abdominal tenderness NEURO: Pt is awake/alert/appropriate, moves all extremitiesx4.  No facial droop.   EXTREMITIES: pulses normal/equal, full ROM SKIN: warm, color normal PSYCH: no abnormalities of mood noted, alert and oriented to situation   ED Treatments / Results  Labs (all labs ordered are listed, but only abnormal results are displayed) Labs Reviewed  URINALYSIS, ROUTINE W REFLEX MICROSCOPIC    EKG  EKG Interpretation None       Radiology No results found.  Procedures Procedures (including critical care time)  Medications Ordered in ED Medications  ketorolac (TORADOL) 30 MG/ML injection 30 mg (30 mg Intravenous Given 12/19/16 2327)  ondansetron (ZOFRAN) injection 4 mg (4 mg Intravenous Given 12/19/16 2326)     Initial  Impression / Assessment and Plan / ED Course  I have reviewed the triage vital signs and the nursing notes.  Pertinent labs   results that were available during my care of the patient were reviewed by me and considered in my medical decision making (see chart for details). Narcotic database reviewed and considered in decision making     Pt with multiple visits previously for kidney stones He has had multiple imaging  modalities Will treat pain and reassess  12:05 AM Pt improved No distress, using phone Referred to urology No signs of acute abdominal emergency at this time   Final Clinical Impressions(s) / ED Diagnoses   Final diagnoses:  Flank pain    New Prescriptions Current Discharge Medication List       Zadie Rhine, MD 12/20/16 0006

## 2016-12-19 NOTE — ED Notes (Signed)
Pt says hx of multiple stones. right flank pain.

## 2016-12-19 NOTE — ED Triage Notes (Signed)
RIGHT FLANK PAIN

## 2016-12-20 NOTE — ED Notes (Signed)
Pt alert & oriented x4, stable gait. Patient given discharge instructions, paperwork & prescription(s). Patient  instructed to stop at the registration desk to finish any additional paperwork. Patient verbalized understanding. Pt left department w/ no further questions. 

## 2018-01-24 ENCOUNTER — Emergency Department (HOSPITAL_COMMUNITY)
Admission: EM | Admit: 2018-01-24 | Discharge: 2018-01-24 | Disposition: A | Discharge status: Home / Self Care | Payer: Self-pay | Attending: Emergency Medicine | Admitting: Emergency Medicine

## 2018-01-24 ENCOUNTER — Encounter (HOSPITAL_COMMUNITY): Payer: Self-pay | Admitting: Emergency Medicine

## 2018-01-24 ENCOUNTER — Emergency Department (HOSPITAL_COMMUNITY): Payer: Self-pay

## 2018-01-24 ENCOUNTER — Other Ambulatory Visit: Payer: Self-pay

## 2018-01-24 DIAGNOSIS — X509XXA Other and unspecified overexertion or strenuous movements or postures, initial encounter: Secondary | ICD-10-CM | POA: Insufficient documentation

## 2018-01-24 DIAGNOSIS — M25531 Pain in right wrist: Secondary | ICD-10-CM

## 2018-01-24 DIAGNOSIS — I10 Essential (primary) hypertension: Secondary | ICD-10-CM | POA: Insufficient documentation

## 2018-01-24 DIAGNOSIS — Y929 Unspecified place or not applicable: Secondary | ICD-10-CM | POA: Insufficient documentation

## 2018-01-24 DIAGNOSIS — F17210 Nicotine dependence, cigarettes, uncomplicated: Secondary | ICD-10-CM | POA: Insufficient documentation

## 2018-01-24 DIAGNOSIS — Y999 Unspecified external cause status: Secondary | ICD-10-CM | POA: Insufficient documentation

## 2018-01-24 DIAGNOSIS — S6391XA Sprain of unspecified part of right wrist and hand, initial encounter: Secondary | ICD-10-CM | POA: Insufficient documentation

## 2018-01-24 DIAGNOSIS — S63501A Unspecified sprain of right wrist, initial encounter: Secondary | ICD-10-CM

## 2018-01-24 DIAGNOSIS — Y9389 Activity, other specified: Secondary | ICD-10-CM | POA: Insufficient documentation

## 2018-01-24 MED ORDER — DICLOFENAC SODIUM 50 MG PO TBEC: 50.0000 mg | DELAYED_RELEASE_TABLET | Freq: Two times a day (BID) | ORAL | 0 refills | Status: AC

## 2018-01-24 MED ORDER — HYDROCODONE-ACETAMINOPHEN 5-325 MG PO TABS
2.0000 | ORAL_TABLET | Freq: Once | ORAL | Status: AC
  Administered 2018-01-24: 2 via ORAL
  Filled 2018-01-24: qty 2

## 2018-01-24 MED ORDER — DICLOFENAC SODIUM 50 MG PO TBEC: 50.0000 mg | DELAYED_RELEASE_TABLET | Freq: Two times a day (BID) | ORAL | 0 refills | Status: DC

## 2018-01-24 NOTE — ED Provider Notes (Signed)
[facility] EMERGENCY DEPARTMENT Provider Note   CSN: [id number] Arrival date & time: 01/24/18  1856     History   Chief Complaint Chief Complaint  Patient presents with  . Wrist Pain    HPI Jeremy Nichols is a 37 y.o. male.  The history is provided by the patient. No language interpreter was used.  Wrist Pain  This is a new problem. The current episode started yesterday. The problem occurs constantly. The problem has been gradually worsening. Nothing aggravates the symptoms. Nothing relieves the symptoms. He has tried nothing for the symptoms. The treatment provided no relief.  Pt reports he twisted wrist changing a tire.  Pt complains of pain in his right wrist   Past Medical History:  Diagnosis Date  . Hypertension   . Kidney stones   . Pain, dental   . Peptic ulcer     Patient Active Problem List   Diagnosis Date Noted  . SPRAIN&STRAIN OTHER SPECIFIED SITES KNEE&LEG 10/27/2009    History reviewed. No pertinent surgical history.      Home Medications    Prior to Admission medications   Medication Sig Start Date End Date Taking? Authorizing Provider  diclofenac (VOLTAREN) 50 MG EC tablet Take 1 tablet (50 mg total) by mouth 2 (two) times daily. 01/24/18   Elson Areas, PA-C    Family History Family History  Problem Relation Age of Onset  . Hypertension Father   . Diabetes Other   . Hypertension Other     Social History Social History   Tobacco Use  . Smoking status: Current Every Day Smoker    Packs/day: 1.00    Types: Cigarettes  . Smokeless tobacco: Never Used  Substance Use Topics  . Alcohol use: Yes    Alcohol/week: 6.0 standard drinks    Types: 6 Shots of liquor per week    Comment: occasional  . Drug use: No     Allergies   Tramadol hcl   Review of Systems Review of Systems  Musculoskeletal: Positive for arthralgias and myalgias.  All other systems reviewed and are negative.    Physical Exam Updated Vital Signs BP (!)  145/91 (BP Location: Left Arm)   Pulse (!) 115   Temp 98.2 F (36.8 C) (Oral)   Resp 18   Ht 6' (1.829 m)   Wt 95.3 kg   SpO2 97%   BMI 28.48 kg/m   Physical Exam  Constitutional: He appears well-developed and well-nourished.  Musculoskeletal: Normal range of motion. He exhibits tenderness.  Pain with movement,  Tender to palpation,  nv and ns intact   Neurological: He is alert.  Skin: Skin is warm.  Psychiatric: He has a normal mood and affect.  Nursing note and vitals reviewed.    ED Treatments / Results  Labs (all labs ordered are listed, but only abnormal results are displayed) Labs Reviewed - No data to display  EKG None  Radiology Dg Wrist Complete Right  Result Date: 01/24/2018 CLINICAL DATA:  Throbbing hand pain after pushing object. Assess for fracture. EXAM: RIGHT WRIST - COMPLETE 3+ VIEW COMPARISON:  RIGHT hand radiograph September 30, 2014 FINDINGS: There is no evidence of fracture or dislocation. There is no evidence of arthropathy or other focal bone abnormality. Soft tissues are unremarkable. IMPRESSION: Negative. Electronically Signed   By: Awilda Metro M.D.   On: 01/24/2018 20:03    Procedures Procedures (including critical care time)  Medications Ordered in ED Medications  HYDROcodone-acetaminophen (NORCO/VICODIN) 5-325 MG  per tablet 2 tablet (has no administration in time range)     Initial Impression / Assessment and Plan / ED Course  I have reviewed the triage vital signs and the nursing notes.  Pertinent labs & imaging results that were available during my care of the patient were reviewed by me and considered in my medical decision making (see chart for details).     MDM  Xray no fracture.  I suspect sprain,  Pt placed in a splint.  Rx for voltaren.   Pt advised to follow up with Orthopaedist if pain persist   Final Clinical Impressions(s) / ED Diagnoses   Final diagnoses:  Sprain of right wrist, initial encounter  Right wrist pain     ED Discharge Orders         Ordered    diclofenac (VOLTAREN) 50 MG EC tablet  2 times daily,   Status:  Discontinued     01/24/18 2028    diclofenac (VOLTAREN) 50 MG EC tablet  2 times daily     01/24/18 2029        An After Visit Summary was printed and given to the patient.    Osie Cheeks 01/24/18 2033    Jacalyn Lefevre, MD 01/24/18 2056

## 2018-01-24 NOTE — ED Triage Notes (Signed)
Pt states he bent his wrist back changing a tire when he felt a pop, c/o wrist pain, minimal swelling

## 2018-01-24 NOTE — Discharge Instructions (Addendum)
Schedule to see the Orthopaedist for recheck in 1 week if pain persist  

## 2018-03-26 ENCOUNTER — Other Ambulatory Visit: Payer: Self-pay

## 2018-03-26 ENCOUNTER — Emergency Department (HOSPITAL_COMMUNITY): Payer: Self-pay

## 2018-03-26 ENCOUNTER — Emergency Department (HOSPITAL_COMMUNITY)
Admission: EM | Admit: 2018-03-26 | Discharge: 2018-03-26 | Disposition: A | Discharge status: Home / Self Care | Payer: Self-pay | Attending: Emergency Medicine | Admitting: Emergency Medicine

## 2018-03-26 DIAGNOSIS — M25571 Pain in right ankle and joints of right foot: Secondary | ICD-10-CM | POA: Insufficient documentation

## 2018-03-26 DIAGNOSIS — F17210 Nicotine dependence, cigarettes, uncomplicated: Secondary | ICD-10-CM | POA: Insufficient documentation

## 2018-03-26 DIAGNOSIS — I10 Essential (primary) hypertension: Secondary | ICD-10-CM | POA: Insufficient documentation

## 2018-03-26 MED ORDER — HYDROCODONE-ACETAMINOPHEN 5-325 MG PO TABS: 1.0000 | ORAL_TABLET | Freq: Four times a day (QID) | ORAL | 0 refills | Status: AC | PRN

## 2018-03-26 MED ORDER — INDOMETHACIN 25 MG PO CAPS: 25.0000 mg | ORAL_CAPSULE | Freq: Three times a day (TID) | ORAL | 0 refills | Status: AC | PRN

## 2018-03-26 MED ORDER — HYDROCODONE-ACETAMINOPHEN 5-325 MG PO TABS
1.0000 | ORAL_TABLET | Freq: Once | ORAL | Status: AC
  Administered 2018-03-26: 1 via ORAL
  Filled 2018-03-26: qty 1

## 2018-03-26 NOTE — ED Triage Notes (Signed)
Pt c/o right ankle pain x 4 days; pt is unsure how he injured it; pt has some swelling to outer ankle; pt has limited rom; pt states he took ibuprofen pta with no relief

## 2018-03-26 NOTE — ED Provider Notes (Addendum)
[facility] EMERGENCY DEPARTMENT Provider Note   CSN: [id number] Arrival date & time: 03/26/18  0124     History   Chief Complaint Chief Complaint  Patient presents with  . Ankle Pain    HPI Jeremy Nichols is a 38 y.o. male.  Patient is a 38 year old male with past medical history of hypertension, renal calculi.  He presents today for evaluation of right ankle pain.  It has been hurting for the past several days.  It began in the absence of any injury or trauma.  Pain is worse of the lateral aspect of the ankle and worse when he ambulates and presses on it.  The history is provided by the patient.  Ankle Pain  Location:  Ankle Injury: no   Ankle location:  R ankle Pain details:    Quality:  Sharp   Radiates to:  Does not radiate   Severity:  Moderate   Onset quality:  Sudden   Timing:  Constant   Progression:  Unchanged Chronicity:  New Worsened by:  Bearing weight and activity Ineffective treatments:  None tried   Past Medical History:  Diagnosis Date  . Hypertension   . Kidney stones   . Pain, dental   . Peptic ulcer     Patient Active Problem List   Diagnosis Date Noted  . SPRAIN&STRAIN OTHER SPECIFIED SITES KNEE&LEG 10/27/2009    No past surgical history on file.      Home Medications    Prior to Admission medications   Medication Sig Start Date End Date Taking? Authorizing Provider  diclofenac (VOLTAREN) 50 MG EC tablet Take 1 tablet (50 mg total) by mouth 2 (two) times daily. 01/24/18   Elson AreasSofia, Leslie K, PA-C    Family History Family History  Problem Relation Age of Onset  . Hypertension Father   . Diabetes Other   . Hypertension Other     Social History Social History   Tobacco Use  . Smoking status: Current Every Day Smoker    Packs/day: 1.00    Types: Cigarettes  . Smokeless tobacco: Never Used  Substance Use Topics  . Alcohol use: Yes    Alcohol/week: 6.0 standard drinks    Types: 6 Shots of liquor per week    Comment:  occasional  . Drug use: No     Allergies   Tramadol hcl   Review of Systems Review of Systems  All other systems reviewed and are negative.    Physical Exam Updated Vital Signs Ht 6' (1.829 m)   Wt 95.3 kg   BMI 28.48 kg/m   Physical Exam Nursing note reviewed. Exam conducted with a chaperone present.  Constitutional:      Appearance: Normal appearance.  HENT:     Head: Normocephalic.  Pulmonary:     Effort: Pulmonary effort is normal.  Musculoskeletal:     Comments: Right ankle is tender to palpation with mild swelling inferior to the lateral malleolus.  There is pain with range of motion.  Skin:    General: Skin is warm and dry.  Neurological:     Mental Status: He is alert.      ED Treatments / Results  Labs (all labs ordered are listed, but only abnormal results are displayed) Labs Reviewed - No data to display  EKG None  Radiology No results found.  Procedures Procedures (including critical care time)  Medications Ordered in ED Medications - No data to display   Initial Impression / Assessment and Plan /  ED Course  I have reviewed the triage vital signs and the nursing notes.  Pertinent labs & imaging results that were available during my care of the patient were reviewed by me and considered in my medical decision making (see chart for details).  Patient's x-rays are negative with the exception of a small joint effusion.  I suspect this is either gout or some other inflammatory process.  He will be treated with indomethacin, pain medicine, and is to follow-up with primary doctor if not improving in the next few days.  I have considered a septic joint, however there is no fever and I doubt this to be the case.  [facility] database reviewed prior to prescription of pain medication.  Patient with only 1 prior prescription on history in April 2018.  Final Clinical Impressions(s) / ED Diagnoses   Final diagnoses:  None    ED Discharge  Orders    None       Geoffery Lyons, MD 03/26/18 5859    Geoffery Lyons, MD 03/26/18 [phone]

## 2018-03-26 NOTE — Discharge Instructions (Addendum)
Indomethacin as prescribed.  Hydrocodone as prescribed as needed for pain.  With your primary doctor if not improving in the next week.

## 2018-06-05 ENCOUNTER — Emergency Department (HOSPITAL_COMMUNITY): Payer: Self-pay

## 2018-06-05 ENCOUNTER — Emergency Department (HOSPITAL_COMMUNITY)
Admission: EM | Admit: 2018-06-05 | Discharge: 2018-06-06 | Disposition: A | Discharge status: Home / Self Care | Payer: Self-pay | Attending: Emergency Medicine | Admitting: Emergency Medicine

## 2018-06-05 ENCOUNTER — Other Ambulatory Visit: Payer: Self-pay

## 2018-06-05 ENCOUNTER — Encounter (HOSPITAL_COMMUNITY): Payer: Self-pay

## 2018-06-05 DIAGNOSIS — R109 Unspecified abdominal pain: Secondary | ICD-10-CM | POA: Insufficient documentation

## 2018-06-05 DIAGNOSIS — Z79899 Other long term (current) drug therapy: Secondary | ICD-10-CM | POA: Insufficient documentation

## 2018-06-05 DIAGNOSIS — F17210 Nicotine dependence, cigarettes, uncomplicated: Secondary | ICD-10-CM | POA: Insufficient documentation

## 2018-06-05 DIAGNOSIS — I10 Essential (primary) hypertension: Secondary | ICD-10-CM | POA: Insufficient documentation

## 2018-06-05 MED ORDER — SODIUM CHLORIDE 0.9 % IV SOLN
INTRAVENOUS | Status: DC
  Administered 2018-06-06: 01:00:00 via INTRAVENOUS

## 2018-06-05 MED ORDER — KETOROLAC TROMETHAMINE 30 MG/ML IJ SOLN
30.0000 mg | Freq: Once | INTRAMUSCULAR | Status: AC
  Administered 2018-06-06: 30 mg via INTRAVENOUS
  Filled 2018-06-05: qty 1

## 2018-06-05 MED ORDER — ONDANSETRON HCL 4 MG/2ML IJ SOLN
4.0000 mg | Freq: Once | INTRAMUSCULAR | Status: AC
  Administered 2018-06-06: 4 mg via INTRAVENOUS
  Filled 2018-06-05: qty 2

## 2018-06-05 MED ORDER — SODIUM CHLORIDE 0.9 % IV BOLUS
500.0000 mL | Freq: Once | INTRAVENOUS | Status: AC
  Administered 2018-06-06: 500 mL via INTRAVENOUS

## 2018-06-05 NOTE — ED Provider Notes (Signed)
[facility] EMERGENCY DEPARTMENT Provider Note   CSN: [id number] Arrival date & time: 06/05/18  2209    History   Chief Complaint Chief Complaint  Patient presents with   Flank Pain    right    HPI Jeremy Nichols is a 38 y.o. male.     Flank Pain  This is a new problem. The current episode started more than 2 days ago. The problem occurs constantly. The problem has been gradually worsening. Pertinent negatives include no chest pain, no abdominal pain, no headaches and no shortness of breath. Nothing aggravates the symptoms. Nothing relieves the symptoms.   Pt noticed the sx on Sunday, pain in the left flank. Since then the pain has been increasing.  He has a history of kidney stones and he noticed blood in his urine today.  The sx feel like he has a recurrent kidney stone. Past Medical History:  Diagnosis Date   Hypertension    Kidney stones    Pain, dental    Peptic ulcer     Patient Active Problem List   Diagnosis Date Noted   SPRAIN&STRAIN OTHER SPECIFIED SITES KNEE&LEG 10/27/2009    History reviewed. No pertinent surgical history.      Home Medications    Prior to Admission medications   Medication Sig Start Date End Date Taking? Authorizing Provider  cyclobenzaprine (FLEXERIL) 10 MG tablet Take 1 tablet (10 mg total) by mouth 2 (two) times daily as needed for muscle spasms. 06/06/18   Linwood Dibbles, MD  diclofenac (VOLTAREN) 50 MG EC tablet Take 1 tablet (50 mg total) by mouth 2 (two) times daily. 01/24/18   Elson Areas, PA-C  HYDROcodone-acetaminophen (NORCO) 5-325 MG tablet Take 1-2 tablets by mouth every 6 (six) hours as needed. 03/26/18   Geoffery Lyons, MD  indomethacin (INDOCIN) 25 MG capsule Take 1 capsule (25 mg total) by mouth 3 (three) times daily as needed. 03/26/18   Geoffery Lyons, MD  naproxen (NAPROSYN) 500 MG tablet Take 1 tablet (500 mg total) by mouth 2 (two) times daily with a meal. As needed for pain 06/06/18   Linwood Dibbles, MD    Family  History Family History  Problem Relation Age of Onset   Hypertension Father    Diabetes Other    Hypertension Other     Social History Social History   Tobacco Use   Smoking status: Current Every Day Smoker    Packs/day: 1.00    Types: Cigarettes   Smokeless tobacco: Never Used  Substance Use Topics   Alcohol use: Yes    Alcohol/week: 6.0 standard drinks    Types: 6 Shots of liquor per week    Comment: occasional   Drug use: No     Allergies   Tramadol hcl   Review of Systems Review of Systems  Constitutional: Negative for fever.  Respiratory: Negative for shortness of breath.   Cardiovascular: Negative for chest pain.  Gastrointestinal: Negative for abdominal pain.  Genitourinary: Positive for flank pain.  Neurological: Negative for weakness, numbness and headaches.  All other systems reviewed and are negative.    Physical Exam Updated Vital Signs BP (!) 166/99 (BP Location: Right Arm)    Pulse (!) 101    Temp 98.2 F (36.8 C) (Oral)    Resp 18    Ht 1.829 m (6')    Wt 95.3 kg    SpO2 98%    BMI 28.48 kg/m   Physical Exam Vitals signs and nursing note reviewed.  Constitutional:      General: He is not in acute distress.    Appearance: He is well-developed.  HENT:     Head: Normocephalic and atraumatic.     Right Ear: External ear normal.     Left Ear: External ear normal.  Eyes:     General: No scleral icterus.       Right eye: No discharge.        Left eye: No discharge.     Conjunctiva/sclera: Conjunctivae normal.  Neck:     Musculoskeletal: Neck supple.     Trachea: No tracheal deviation.  Cardiovascular:     Rate and Rhythm: Normal rate and regular rhythm.  Pulmonary:     Effort: Pulmonary effort is normal. No respiratory distress.     Breath sounds: Normal breath sounds. No stridor. No wheezing or rales.  Abdominal:     General: Bowel sounds are normal. There is no distension.     Palpations: Abdomen is soft.     Tenderness: There  is no abdominal tenderness. There is left CVA tenderness. There is no guarding or rebound.  Musculoskeletal:        General: No tenderness.  Skin:    General: Skin is warm and dry.     Findings: No rash.  Neurological:     Mental Status: He is alert.     Cranial Nerves: No cranial nerve deficit (no facial droop, extraocular movements intact, no slurred speech).     Sensory: No sensory deficit.     Motor: No abnormal muscle tone or seizure activity.     Coordination: Coordination normal.      ED Treatments / Results  Labs (all labs ordered are listed, but only abnormal results are displayed) Labs Reviewed  CBC WITH DIFFERENTIAL/PLATELET  COMPREHENSIVE METABOLIC PANEL  LIPASE, BLOOD  URINALYSIS, ROUTINE W REFLEX MICROSCOPIC    EKG None  Radiology Ct Renal Stone Study  Result Date: 06/05/2018 CLINICAL DATA:  Right-sided flank pain EXAM: CT ABDOMEN AND PELVIS WITHOUT CONTRAST TECHNIQUE: Multidetector CT imaging of the abdomen and pelvis was performed following the standard protocol without IV contrast. COMPARISON:  CT 09/09/2015 FINDINGS: Lower chest: No acute abnormality. Hepatobiliary: Table hypodensity within the left hepatic lobe, likely a small cysts. No gallstone or biliary dilatation Pancreas: Unremarkable. No pancreatic ductal dilatation or surrounding inflammatory changes. Spleen: Normal in size without focal abnormality. Adrenals/Urinary Tract: Adrenal glands normal. No hydronephrosis. Multiple intrarenal stones bilaterally, measuring up to 4 mm in the lower pole right kidney and 2 mm mid left kidney. The bladder is normal Stomach/Bowel: Stomach is within normal limits. Appendix appears normal. No evidence of bowel wall thickening, distention, or inflammatory changes. Vascular/Lymphatic: Mild aortic atherosclerosis.  No aneurysm. Reproductive: Prostate is unremarkable. Other: Negative for free air or free fluid Musculoskeletal: No acute or significant osseous findings.  IMPRESSION: Intrarenal stones without hydronephrosis or ureteral stone. Electronically Signed   By: Jasmine Pang M.D.   On: 06/05/2018 23:30    Procedures Procedures (including critical care time)  Medications Ordered in ED Medications  ketorolac (TORADOL) 30 MG/ML injection 30 mg (has no administration in time range)  sodium chloride 0.9 % bolus 500 mL (has no administration in time range)    And  0.9 %  sodium chloride infusion (has no administration in time range)  ondansetron (ZOFRAN) injection 4 mg (has no administration in time range)     Initial Impression / Assessment and Plan / ED Course  I have reviewed the triage  vital signs and the nursing notes.  Pertinent labs & imaging results that were available during my care of the patient were reviewed by me and considered in my medical decision making (see chart for details).     Presented to the emergency room complaints of left flank pain.  He felt that he had a recurrent kidney stone.  He does have left flank tenderness.  CT scan shows intrarenal stones but no evidence of any ureteral stone.  Laboratory tests are pending.  Symptoms are likely musculoskeletal.  Dr Erin Hearing will follow up on results  Final Clinical Impressions(s) / ED Diagnoses   Final diagnoses:  Flank pain    ED Discharge Orders         Ordered    naproxen (NAPROSYN) 500 MG tablet  2 times daily with meals     06/06/18 0005    cyclobenzaprine (FLEXERIL) 10 MG tablet  2 times daily PRN     06/06/18 0005           Linwood Dibbles, MD 06/06/18 0006

## 2018-06-05 NOTE — ED Triage Notes (Signed)
Pt reports right sided flank pain that started Sunday and is getting worse. Pain has increased and pt reports hematuria. Hx of kidney stones per pt report.

## 2018-06-05 NOTE — ED Notes (Signed)
Patient transported to X-ray 

## 2018-06-06 LAB — CBC WITH DIFFERENTIAL/PLATELET
ABS IMMATURE GRANULOCYTES: 0.02 10*3/uL (ref 0.00–0.07)
BASOS ABS: 0.1 10*3/uL (ref 0.0–0.1)
BASOS PCT: 1 %
Eosinophils Absolute: 0.2 10*3/uL (ref 0.0–0.5)
Eosinophils Relative: 2 %
HCT: 49 % (ref 39.0–52.0)
HEMOGLOBIN: 16.8 g/dL (ref 13.0–17.0)
Immature Granulocytes: 0 %
Lymphocytes Relative: 43 %
Lymphs Abs: 3.9 10*3/uL (ref 0.7–4.0)
MCH: 33.5 pg (ref 26.0–34.0)
MCHC: 34.3 g/dL (ref 30.0–36.0)
MCV: 97.8 fL (ref 80.0–100.0)
Monocytes Absolute: 0.7 10*3/uL (ref 0.1–1.0)
Monocytes Relative: 7 %
NRBC: 0 % (ref 0.0–0.2)
Neutro Abs: 4.3 10*3/uL (ref 1.7–7.7)
Neutrophils Relative %: 47 %
PLATELETS: 298 10*3/uL (ref 150–400)
RBC: 5.01 MIL/uL (ref 4.22–5.81)
RDW: 12 % (ref 11.5–15.5)
WBC: 9.1 10*3/uL (ref 4.0–10.5)

## 2018-06-06 LAB — COMPREHENSIVE METABOLIC PANEL
ALK PHOS: 65 U/L (ref 38–126)
ALT: 26 U/L (ref 0–44)
ANION GAP: 8 (ref 5–15)
AST: 26 U/L (ref 15–41)
Albumin: 4.1 g/dL (ref 3.5–5.0)
BUN: 13 mg/dL (ref 6–20)
CALCIUM: 9 mg/dL (ref 8.9–10.3)
CO2: 26 mmol/L (ref 22–32)
CREATININE: 1.08 mg/dL (ref 0.61–1.24)
Chloride: 104 mmol/L (ref 98–111)
Glucose, Bld: 118 mg/dL — ABNORMAL HIGH (ref 70–99)
Potassium: 3.6 mmol/L (ref 3.5–5.1)
SODIUM: 138 mmol/L (ref 135–145)
TOTAL PROTEIN: 7.4 g/dL (ref 6.5–8.1)
Total Bilirubin: 0.8 mg/dL (ref 0.3–1.2)

## 2018-06-06 LAB — URINALYSIS, ROUTINE W REFLEX MICROSCOPIC
BILIRUBIN URINE: NEGATIVE
Glucose, UA: NEGATIVE mg/dL
HGB URINE DIPSTICK: NEGATIVE
KETONES UR: NEGATIVE mg/dL
Leukocytes,Ua: NEGATIVE
NITRITE: NEGATIVE
PROTEIN: NEGATIVE mg/dL
Specific Gravity, Urine: 1.026 (ref 1.005–1.030)
pH: 5 (ref 5.0–8.0)

## 2018-06-06 LAB — LIPASE, BLOOD: LIPASE: 58 U/L — AB (ref 11–51)

## 2018-06-06 MED ORDER — NAPROXEN 500 MG PO TABS: 500.0000 mg | ORAL_TABLET | Freq: Two times a day (BID) | ORAL | 0 refills | Status: AC

## 2018-06-06 MED ORDER — CYCLOBENZAPRINE HCL 10 MG PO TABS: 10.0000 mg | ORAL_TABLET | Freq: Two times a day (BID) | ORAL | 0 refills | Status: AC | PRN

## 2018-06-06 NOTE — Discharge Instructions (Signed)
Take the medications as prescribed, follow-up with a primary care doctor if the symptoms are not improving

## 2019-01-14 ENCOUNTER — Other Ambulatory Visit: Payer: Self-pay | Admitting: *Deleted

## 2019-01-14 DIAGNOSIS — Z20822 Contact with and (suspected) exposure to covid-19: Secondary | ICD-10-CM

## 2019-01-15 LAB — NOVEL CORONAVIRUS, NAA: SARS-CoV-2, NAA: NOT DETECTED

## 2019-05-26 IMAGING — CT CT RENAL STONE PROTOCOL
2 of 4 series · 17 of 46 positions shown, 19 images · non-contrast
Comparison: CT 09/09/2015

CLINICAL DATA: Right-sided flank pain

EXAM:
CT ABDOMEN AND PELVIS WITHOUT CONTRAST
TECHNIQUE: Multidetector CT imaging of the abdomen and pelvis was performed
following the standard protocol without IV contrast.

[Series 2: axial st · axial · 0.81mm/px · z∈[+925,+1380]mm · 14 of 101 slices shown, 16 images]
[im 5/101  soft-tissue]
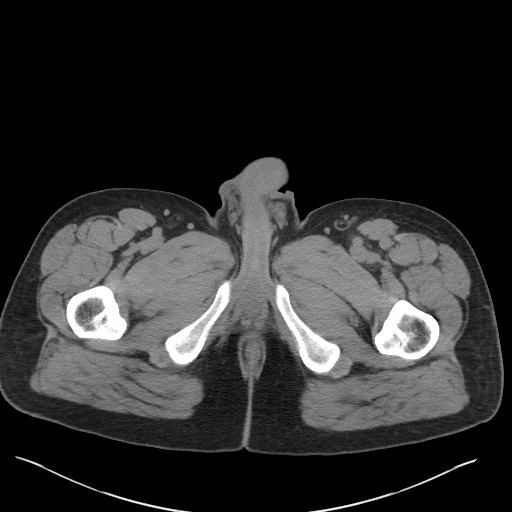
[im 5/101  bone]
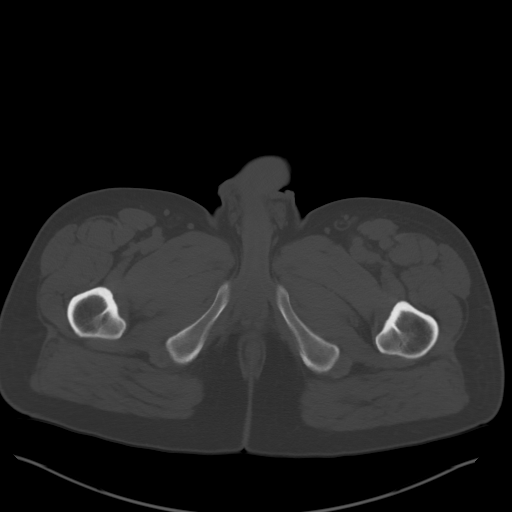
[im 14/101  soft-tissue]
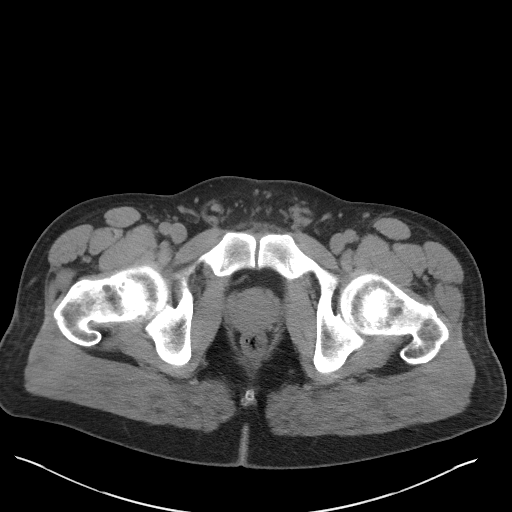
[im 19/101  soft-tissue]
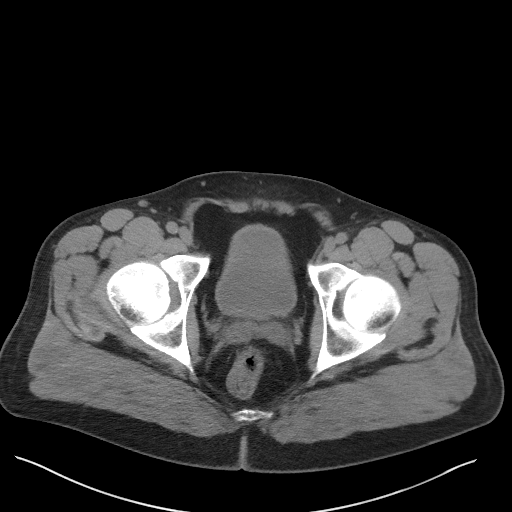
[im 28/101  soft-tissue]
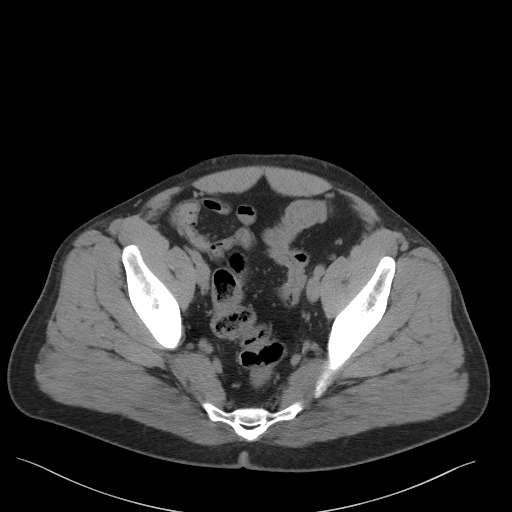
[im 32/101  soft-tissue]
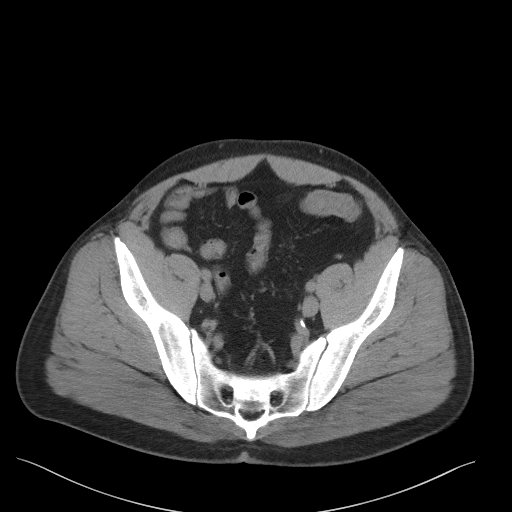
[im 41/101  soft-tissue]
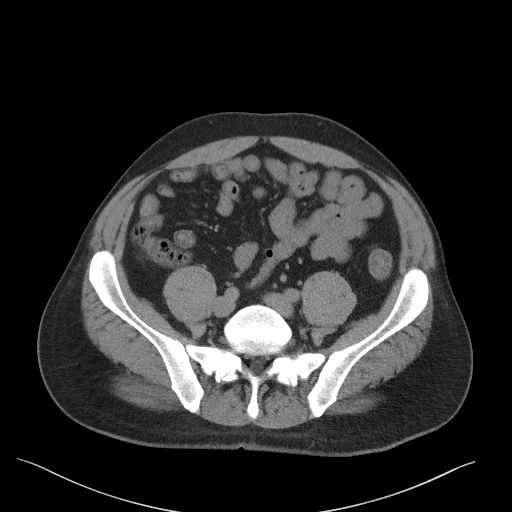
[im 46/101  soft-tissue]
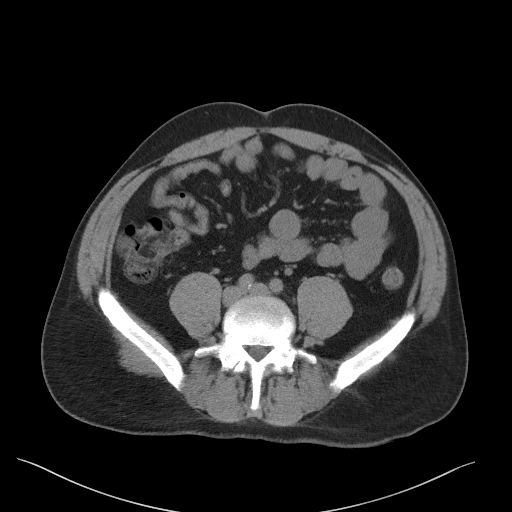
[im 55/101  soft-tissue]
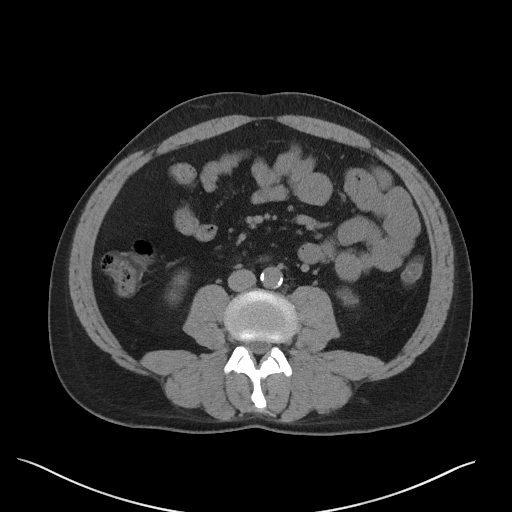
[im 60/101  soft-tissue]
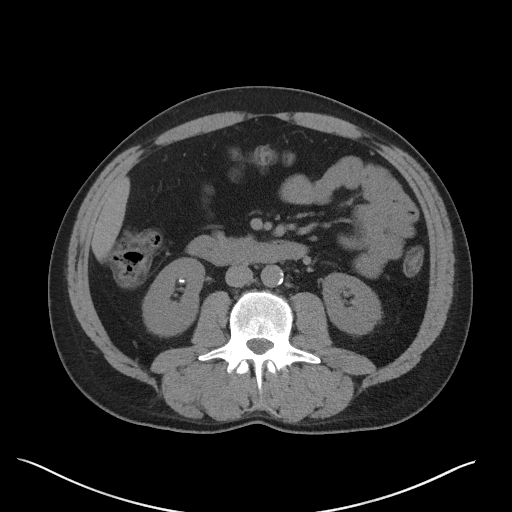
[im 60/101  bone]
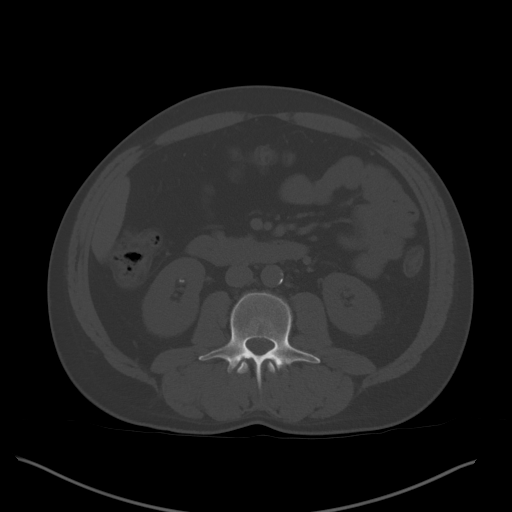
[im 69/101  soft-tissue]
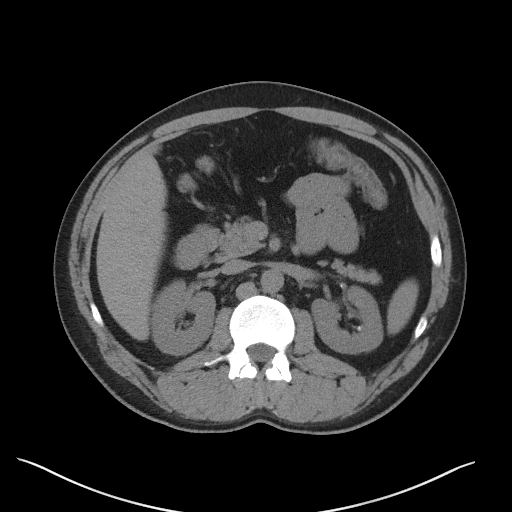
[im 73/101  soft-tissue]
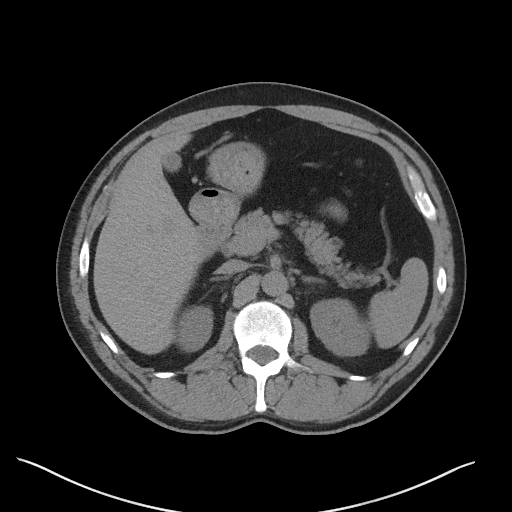
[im 82/101  soft-tissue]
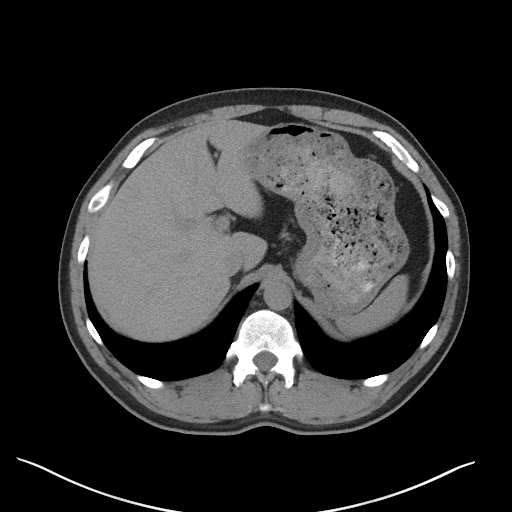
[im 87/101  soft-tissue]
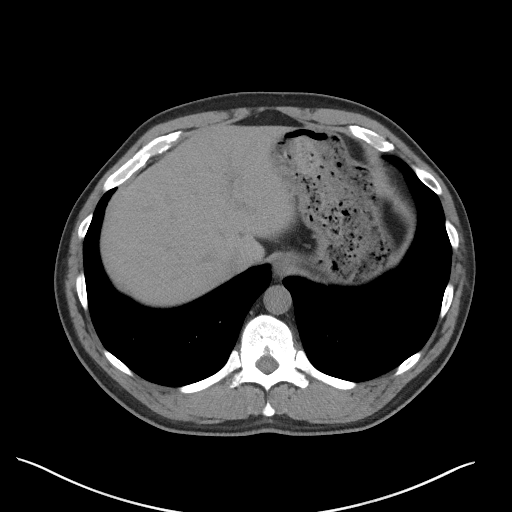
[im 96/101  soft-tissue]
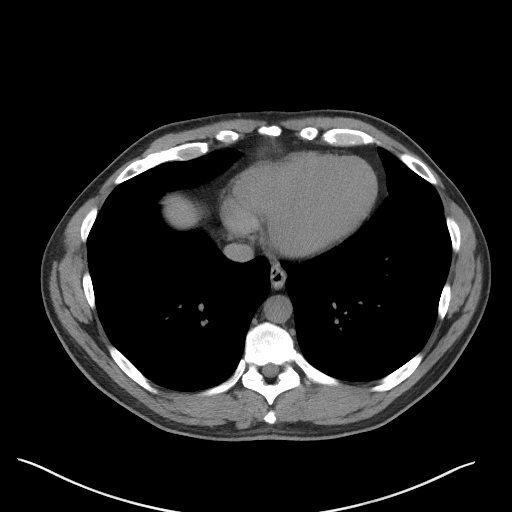

[Series 5: coronal st · coronal · 0.84mm/px · 3 of 95 slices shown]
[im 32/95  soft-tissue]
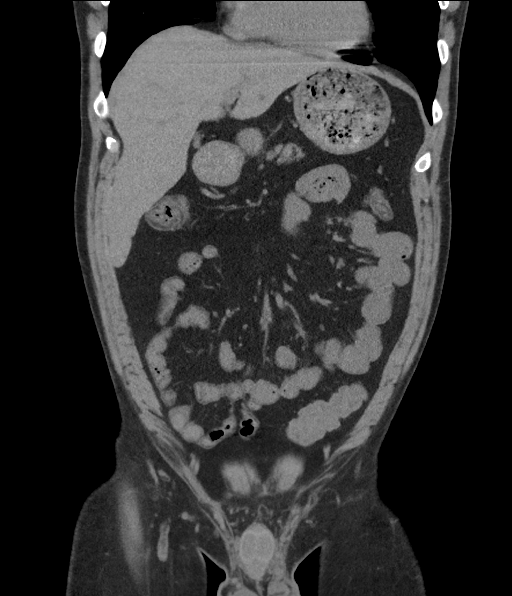
[im 42/95  soft-tissue]
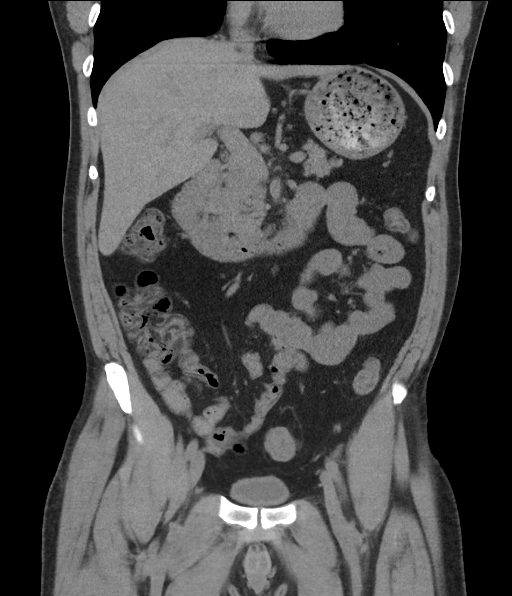
[im 53/95  soft-tissue]
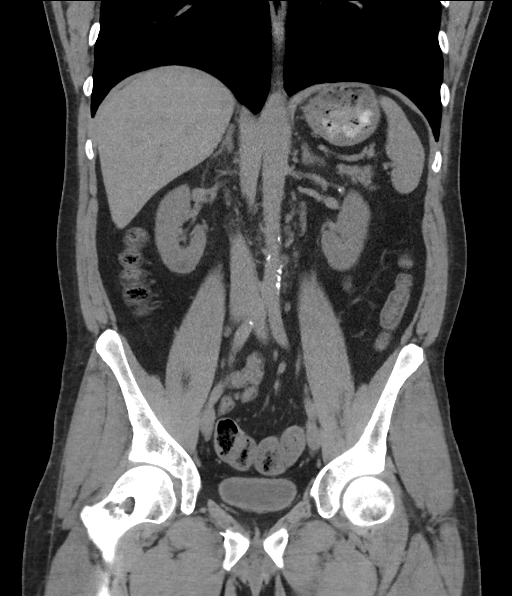

[17 of 46 positions shown; findings below may reference images not displayed]

FINDINGS: Lower chest: No acute abnormality.

Hepatobiliary: Table hypodensity within the left hepatic lobe,
likely a small cysts. No gallstone or biliary dilatation

Pancreas: Unremarkable. No pancreatic ductal dilatation or
surrounding inflammatory changes.

Spleen: Normal in size without focal abnormality.

Adrenals/Urinary Tract: Adrenal glands normal. No hydronephrosis.
Multiple intrarenal stones bilaterally, measuring up to 4 mm in the
lower pole right kidney and 2 mm mid left kidney. The bladder is
normal

Stomach/Bowel: Stomach is within normal limits. Appendix appears
normal. No evidence of bowel wall thickening, distention, or
inflammatory changes.

Vascular/Lymphatic: Mild aortic atherosclerosis.  No aneurysm.

Reproductive: Prostate is unremarkable.

Other: Negative for free air or free fluid

Musculoskeletal: No acute or significant osseous findings.
IMPRESSION: Intrarenal stones without hydronephrosis or ureteral stone.

## 2019-12-19 ENCOUNTER — Other Ambulatory Visit: Payer: Self-pay

## 2019-12-19 DIAGNOSIS — Z20822 Contact with and (suspected) exposure to covid-19: Secondary | ICD-10-CM

## 2019-12-20 LAB — SARS-COV-2, NAA 2 DAY TAT

## 2019-12-20 LAB — NOVEL CORONAVIRUS, NAA: SARS-CoV-2, NAA: DETECTED — AB

## 2019-12-21 ENCOUNTER — Telehealth: Payer: Self-pay | Admitting: Infectious Diseases

## 2020-06-22 ENCOUNTER — Encounter (HOSPITAL_COMMUNITY): Payer: Self-pay | Admitting: Emergency Medicine

## 2020-06-22 ENCOUNTER — Emergency Department (HOSPITAL_COMMUNITY)
Admission: EM | Admit: 2020-06-22 | Discharge: 2020-06-22 | Disposition: A | Discharge status: Home / Self Care | Payer: Self-pay | Attending: Emergency Medicine | Admitting: Emergency Medicine

## 2020-06-22 ENCOUNTER — Other Ambulatory Visit: Payer: Self-pay

## 2020-06-22 DIAGNOSIS — K029 Dental caries, unspecified: Secondary | ICD-10-CM | POA: Insufficient documentation

## 2020-06-22 DIAGNOSIS — K047 Periapical abscess without sinus: Secondary | ICD-10-CM | POA: Insufficient documentation

## 2020-06-22 DIAGNOSIS — I10 Essential (primary) hypertension: Secondary | ICD-10-CM | POA: Insufficient documentation

## 2020-06-22 DIAGNOSIS — F17210 Nicotine dependence, cigarettes, uncomplicated: Secondary | ICD-10-CM | POA: Insufficient documentation

## 2020-06-22 MED ORDER — OXYCODONE HCL 5 MG PO TABS: 2.5000 mg | ORAL_TABLET | Freq: Four times a day (QID) | ORAL | 0 refills | Status: AC | PRN

## 2020-06-22 MED ORDER — AMOXICILLIN 500 MG PO CAPS: 500.0000 mg | ORAL_CAPSULE | Freq: Three times a day (TID) | ORAL | 0 refills | Status: DC

## 2020-06-22 MED ORDER — OXYCODONE-ACETAMINOPHEN 5-325 MG PO TABS
2.0000 | ORAL_TABLET | Freq: Once | ORAL | Status: AC
  Administered 2020-06-22: 2 via ORAL
  Filled 2020-06-22: qty 2

## 2020-06-22 MED ORDER — NAPROXEN 375 MG PO TABS: 375.0000 mg | ORAL_TABLET | Freq: Two times a day (BID) | ORAL | 0 refills | Status: AC

## 2020-06-22 NOTE — ED Notes (Signed)
PA aware of blood pressure, advised patient to take his medication on regular basis and follow up with his doctor

## 2020-06-22 NOTE — ED Triage Notes (Signed)
Pt c/o right upper tooth pain that started x 6 days ago.

## 2020-06-22 NOTE — ED Provider Notes (Signed)
[facility] EMERGENCY DEPARTMENT Provider Note   CSN: [id number] Arrival date & time: 06/22/20  1654     History Chief Complaint  Patient presents with  . Dental Pain    Jeremy Nichols is a 40 y.o. male.  The history is provided by the patient.  Dental Pain Location:  Upper Upper teeth location:  3/RU 1st molar Quality:  Throbbing, aching, sharp and radiating Severity:  Severe Onset quality:  Gradual Duration:  3 weeks Timing:  Constant Progression:  Worsening Chronicity:  Recurrent Context: dental caries and poor dentition   Relieved by:  NSAIDs (up until today when meds stopped treating pain) Worsened by:  Pressure, touching, cold food/drink, hot food/drink and jaw movement Associated symptoms: facial pain and gum swelling   Associated symptoms: no congestion, no difficulty swallowing, no drooling, no facial swelling, no fever, no headaches, no neck pain, no neck swelling, no oral bleeding, no oral lesions and no trismus   Risk factors: lack of dental care and smoking   Risk factors: no alcohol problem, no cancer and no chewing tobacco use        Past Medical History:  Diagnosis Date  . Hypertension   . Kidney stones   . Pain, dental   . Peptic ulcer     Patient Active Problem List   Diagnosis Date Noted  . SPRAIN&STRAIN OTHER SPECIFIED SITES KNEE&LEG 10/27/2009    History reviewed. No pertinent surgical history.     Family History  Problem Relation Age of Onset  . Hypertension Father   . Diabetes Other   . Hypertension Other     Social History   Tobacco Use  . Smoking status: Current Every Day Smoker    Packs/day: 1.00    Types: Cigarettes  . Smokeless tobacco: Never Used  Vaping Use  . Vaping Use: Never used  Substance Use Topics  . Alcohol use: Yes    Alcohol/week: 6.0 standard drinks    Types: 6 Shots of liquor per week    Comment: occasional  . Drug use: No    Home Medications Prior to Admission medications   Medication Sig Start  Date End Date Taking? Authorizing Provider  amoxicillin (AMOXIL) 500 MG capsule Take 1 capsule (500 mg total) by mouth 3 (three) times daily. 06/22/20  Yes Julea Hutto, PA-C  naproxen (NAPROSYN) 375 MG tablet Take 1 tablet (375 mg total) by mouth 2 (two) times daily with a meal. 06/22/20  Yes Dermot Gremillion, PA-C  oxyCODONE (ROXICODONE) 5 MG immediate release tablet Take 0.5-1 tablets (2.5-5 mg total) by mouth every 6 (six) hours as needed for severe pain. 06/22/20  Yes Whisper Kurka, PA-C  cyclobenzaprine (FLEXERIL) 10 MG tablet Take 1 tablet (10 mg total) by mouth 2 (two) times daily as needed for muscle spasms. 06/06/18   Linwood Dibbles, MD  diclofenac (VOLTAREN) 50 MG EC tablet Take 1 tablet (50 mg total) by mouth 2 (two) times daily. 01/24/18   Elson Areas, PA-C  HYDROcodone-acetaminophen (NORCO) 5-325 MG tablet Take 1-2 tablets by mouth every 6 (six) hours as needed. 03/26/18   Geoffery Lyons, MD  indomethacin (INDOCIN) 25 MG capsule Take 1 capsule (25 mg total) by mouth 3 (three) times daily as needed. 03/26/18   Geoffery Lyons, MD  naproxen (NAPROSYN) 500 MG tablet Take 1 tablet (500 mg total) by mouth 2 (two) times daily with a meal. As needed for pain 06/06/18   Linwood Dibbles, MD    Allergies    Tramadol hcl  Review of Systems   Review of Systems  Constitutional: Negative for fever.  HENT: Negative for congestion, drooling, facial swelling, mouth sores, trouble swallowing and voice change.   Musculoskeletal: Negative for neck pain.  Neurological: Negative for headaches.    Physical Exam Updated Vital Signs BP (!) 188/118   Pulse 92   Temp 98.3 F (36.8 C) (Oral)   Resp 17   Ht 6' (1.829 m)   Wt 99.8 kg   SpO2 96%   BMI 29.84 kg/m   Physical Exam Vitals and nursing note reviewed.  Constitutional:      General: He is not in acute distress.    Appearance: He is well-developed. He is not diaphoretic.  HENT:     Head: Normocephalic and atraumatic.     Mouth/Throat:     Dentition:  Abnormal dentition. Dental tenderness, gingival swelling and dental caries present. No dental abscesses.  Eyes:     General: No scleral icterus.    Conjunctiva/sclera: Conjunctivae normal.  Cardiovascular:     Rate and Rhythm: Normal rate and regular rhythm.     Heart sounds: Normal heart sounds.  Pulmonary:     Effort: Pulmonary effort is normal. No respiratory distress.     Breath sounds: Normal breath sounds.  Abdominal:     Palpations: Abdomen is soft.     Tenderness: There is no abdominal tenderness.  Musculoskeletal:     Cervical back: Normal range of motion and neck supple.  Skin:    General: Skin is warm and dry.  Neurological:     Mental Status: He is alert.  Psychiatric:        Behavior: Behavior normal.     ED Results / Procedures / Treatments   Labs (all labs ordered are listed, but only abnormal results are displayed) Labs Reviewed - No data to display  EKG None  Radiology No results found.  Procedures Procedures   Medications Ordered in ED Medications  oxyCODONE-acetaminophen (PERCOCET/ROXICET) 5-325 MG per tablet 2 tablet (2 tablets Oral Given 06/22/20 1745)    ED Course  I have reviewed the triage vital signs and the nursing notes.  Pertinent labs & imaging results that were available during my care of the patient were reviewed by me and considered in my medical decision making (see chart for details).    MDM Rules/Calculators/A&P                          Patient with toothache.  No gross abscess.  Exam unconcerning for Ludwig's angina or spread of infection.  Will treat with penicillin and pain medicine.  Urged patient to follow-up with dentist.    Discussed OP f/u for HTN PDMP reviewed during this encounter.  Final Clinical Impression(s) / ED Diagnoses Final diagnoses:  Dental abscess    Rx / DC Orders ED Discharge Orders         Ordered    amoxicillin (AMOXIL) 500 MG capsule  3 times daily        06/22/20 1743    oxyCODONE  (ROXICODONE) 5 MG immediate release tablet  Every 6 hours PRN        06/22/20 1743    naproxen (NAPROSYN) 375 MG tablet  2 times daily with meals        06/22/20 1743           Arthor Captain, PA-C 06/22/20 1817    Mancel Bale, MD 06/22/20 2354

## 2020-06-22 NOTE — Discharge Instructions (Signed)

## 2020-06-22 NOTE — ED Notes (Signed)
Pt states hes diagnosed with hbp but doesn't have a pcp so he doesn't take meds for his bp.

## 2020-07-08 ENCOUNTER — Encounter (HOSPITAL_COMMUNITY): Payer: Self-pay | Admitting: *Deleted

## 2020-07-08 ENCOUNTER — Emergency Department (HOSPITAL_COMMUNITY)
Admission: EM | Admit: 2020-07-08 | Discharge: 2020-07-08 | Disposition: A | Discharge status: Home / Self Care | Payer: Self-pay | Attending: Emergency Medicine | Admitting: Emergency Medicine

## 2020-07-08 ENCOUNTER — Other Ambulatory Visit: Payer: Self-pay

## 2020-07-08 DIAGNOSIS — I10 Essential (primary) hypertension: Secondary | ICD-10-CM | POA: Insufficient documentation

## 2020-07-08 DIAGNOSIS — K047 Periapical abscess without sinus: Secondary | ICD-10-CM | POA: Insufficient documentation

## 2020-07-08 DIAGNOSIS — F17210 Nicotine dependence, cigarettes, uncomplicated: Secondary | ICD-10-CM | POA: Insufficient documentation

## 2020-07-08 DIAGNOSIS — K0381 Cracked tooth: Secondary | ICD-10-CM | POA: Insufficient documentation

## 2020-07-08 MED ORDER — AMOXICILLIN-POT CLAVULANATE 875-125 MG PO TABS: 1.0000 | ORAL_TABLET | Freq: Two times a day (BID) | ORAL | 0 refills | Status: AC

## 2020-07-08 MED ORDER — ACETAMINOPHEN 500 MG PO TABS
1000.0000 mg | ORAL_TABLET | Freq: Once | ORAL | Status: AC
  Administered 2020-07-08: 1000 mg via ORAL
  Filled 2020-07-08: qty 2

## 2020-07-08 MED ORDER — AMOXICILLIN-POT CLAVULANATE 875-125 MG PO TABS
1.0000 | ORAL_TABLET | Freq: Once | ORAL | Status: AC
  Administered 2020-07-08: 1 via ORAL
  Filled 2020-07-08: qty 1

## 2020-07-08 NOTE — ED Provider Notes (Signed)
[facility] EMERGENCY DEPARTMENT Provider Note   CSN: [id number] Arrival date & time: 07/08/20  1721     History Chief Complaint  Patient presents with  . Dental Pain    Jeremy Nichols is a 40 y.o. male.  Jeremy Nichols is a 40 y.o. male with history of hypertension, kidney stone, peptic ulcer, who presents to the ED for ration of continued dental pain.  He was seen a few weeks ago for pain and swelling over his left upper molars.  He was treated with amoxicillin and he reports it felt like it was starting to improve, but as he finished the antibiotics it seemed like it started to get worse again has been persistent and worsening over the past 5 days and now he has noticed some slight swelling over his upper jaw.  Denies any trouble swallowing, does have pain with chewing.  No fevers or chills.  No nausea or vomiting.  Reports he has an upcoming appointment with a dentist but they could not see him for another week or 2.  Reports he feels like he needs a stronger antibiotic.  Using NSAIDs for pain.        Past Medical History:  Diagnosis Date  . Hypertension   . Kidney stones   . Pain, dental   . Peptic ulcer     Patient Active Problem List   Diagnosis Date Noted  . SPRAIN&STRAIN OTHER SPECIFIED SITES KNEE&LEG 10/27/2009    History reviewed. No pertinent surgical history.     Family History  Problem Relation Age of Onset  . Hypertension Father   . Diabetes Other   . Hypertension Other     Social History   Tobacco Use  . Smoking status: Current Every Day Smoker    Packs/day: 1.00    Types: Cigarettes  . Smokeless tobacco: Never Used  Vaping Use  . Vaping Use: Never used  Substance Use Topics  . Alcohol use: Yes    Alcohol/week: 6.0 standard drinks    Types: 6 Shots of liquor per week    Comment: occasional  . Drug use: No    Home Medications Prior to Admission medications   Medication Sig Start Date End Date Taking? Authorizing Provider   amoxicillin-clavulanate (AUGMENTIN) 875-125 MG tablet Take 1 tablet by mouth 2 (two) times daily. One po bid x 7 days 07/08/20  Yes Dartha Lodge, PA-C  cyclobenzaprine (FLEXERIL) 10 MG tablet Take 1 tablet (10 mg total) by mouth 2 (two) times daily as needed for muscle spasms. 06/06/18   Linwood Dibbles, MD  diclofenac (VOLTAREN) 50 MG EC tablet Take 1 tablet (50 mg total) by mouth 2 (two) times daily. 01/24/18   Elson Areas, PA-C  HYDROcodone-acetaminophen (NORCO) 5-325 MG tablet Take 1-2 tablets by mouth every 6 (six) hours as needed. 03/26/18   Geoffery Lyons, MD  indomethacin (INDOCIN) 25 MG capsule Take 1 capsule (25 mg total) by mouth 3 (three) times daily as needed. 03/26/18   Geoffery Lyons, MD  naproxen (NAPROSYN) 375 MG tablet Take 1 tablet (375 mg total) by mouth 2 (two) times daily with a meal. 06/22/20   Arthor Captain, PA-C  naproxen (NAPROSYN) 500 MG tablet Take 1 tablet (500 mg total) by mouth 2 (two) times daily with a meal. As needed for pain 06/06/18   Linwood Dibbles, MD  oxyCODONE (ROXICODONE) 5 MG immediate release tablet Take 0.5-1 tablets (2.5-5 mg total) by mouth every 6 (six) hours as needed for severe pain.  06/22/20   Arthor Captain, PA-C    Allergies    Tramadol hcl  Review of Systems   Review of Systems  Constitutional: Negative for chills and fever.  HENT: Positive for dental problem and facial swelling. Negative for sore throat and trouble swallowing.   Gastrointestinal: Negative for rectal pain and vomiting.  Musculoskeletal: Negative for neck pain and neck stiffness.  All other systems reviewed and are negative.   Physical Exam Updated Vital Signs BP (!) 160/112 (BP Location: Right Arm)   Pulse (!) 114   Temp 99.1 F (37.3 C) (Oral)   Resp 16   Ht 6' (1.829 m)   Wt 99.8 kg   SpO2 99%   BMI 29.84 kg/m   Physical Exam Vitals and nursing note reviewed.  Constitutional:      General: He is not in acute distress.    Appearance: Normal appearance. He is  well-developed. He is not ill-appearing or diaphoretic.  HENT:     Head: Normocephalic and atraumatic.     Mouth/Throat:     Comments: Swelling and tenderness over the gums over the left upper molars, multiple broken teeth in very poor dentition, there is no clear drainable abscess, no tenderness or swelling over the roof of the mouth or beneath the tongue.  Normal phonation, tolerating secretions, no trismus Eyes:     General:        Right eye: No discharge.        Left eye: No discharge.  Neck:     Comments: No stridor, masses, no torticollis Pulmonary:     Effort: Pulmonary effort is normal. No respiratory distress.  Musculoskeletal:     Cervical back: Neck supple.  Skin:    General: Skin is warm and dry.  Neurological:     Mental Status: He is alert and oriented to person, place, and time.     Coordination: Coordination normal.  Psychiatric:        Mood and Affect: Mood normal.        Behavior: Behavior normal.     ED Results / Procedures / Treatments   Labs (all labs ordered are listed, but only abnormal results are displayed) Labs Reviewed - No data to display  EKG None  Radiology No results found.  Procedures Procedures   Medications Ordered in ED Medications  amoxicillin-clavulanate (AUGMENTIN) 875-125 MG per tablet 1 tablet (1 tablet Oral Given 07/08/20 1911)  acetaminophen (TYLENOL) tablet 1,000 mg (1,000 mg Oral Given 07/08/20 1911)    ED Course  I have reviewed the triage vital signs and the nursing notes.  Pertinent labs & imaging results that were available during my care of the patient were reviewed by me and considered in my medical decision making (see chart for details).    MDM Rules/Calculators/A&P                          Patient with toothache.  No gross abscess.  Exam unconcerning for Ludwig's angina or spread of infection.  Will treat with Augmentin since patient only got partial improvement with amoxicillin and anti-inflammatories medicine.   Urged patient to follow-up with dentist, he has upcoming appointment, provided additional dental resources as well.    Final Clinical Impression(s) / ED Diagnoses Final diagnoses:  Dental infection    Rx / DC Orders ED Discharge Orders         Ordered    amoxicillin-clavulanate (AUGMENTIN) 875-125 MG tablet  2 times daily  07/08/20 1908           Dartha Lodge, PA-C 07/08/20 2006    Pricilla Loveless, MD 07/10/20 [phone]

## 2020-07-08 NOTE — Discharge Instructions (Addendum)
Please take entire course of antibiotics as directed.  Continue using ibuprofen and Tylenol. You will need to follow-up with your dentist for continued management of this.  Return to the emergency department for fevers, swelling or pain under the tongue or in the neck, difficulty breathing or swallowing or any other new or concerning symptoms.

## 2020-07-08 NOTE — ED Triage Notes (Signed)
Dental pain with facial swelling left side

## 2022-01-16 ENCOUNTER — Other Ambulatory Visit: Payer: Self-pay

## 2022-01-16 ENCOUNTER — Encounter (HOSPITAL_COMMUNITY): Payer: Self-pay

## 2022-01-16 ENCOUNTER — Emergency Department (HOSPITAL_COMMUNITY)
Admission: EM | Admit: 2022-01-16 | Discharge: 2022-01-16 | Disposition: A | Discharge status: Home / Self Care | Payer: Self-pay | Attending: Emergency Medicine | Admitting: Emergency Medicine

## 2022-01-16 DIAGNOSIS — K029 Dental caries, unspecified: Secondary | ICD-10-CM | POA: Insufficient documentation

## 2022-01-16 MED ORDER — OXYCODONE-ACETAMINOPHEN 5-325 MG PO TABS
1.0000 | ORAL_TABLET | Freq: Once | ORAL | Status: AC
  Administered 2022-01-16: 1 via ORAL
  Filled 2022-01-16: qty 1

## 2022-01-16 MED ORDER — PENICILLIN V POTASSIUM 500 MG PO TABS: 500.0000 mg | ORAL_TABLET | Freq: Four times a day (QID) | ORAL | 0 refills | Status: AC

## 2022-01-16 NOTE — Discharge Instructions (Addendum)
It was a pleasure taking care of you today!   You will be prescribed Penicillin, take as directed and ensure to complete the entire course of the antibiotic. You may take over the counter 600 mg Ibuprofen every 6 hours and alternate with 500 mg Tylenol every 6 hours as directed.  Attached you will find a resource guide for dentists in the area, call and set up a follow up appointment.  Attached is information from on-call dentist, you may call and set up a follow-up appointment regarding today's ED visit.  You may also follow-up with [facility].  Return to the Emergency Department if you are experiencing increasing/worsening symptoms.

## 2022-01-16 NOTE — ED Provider Notes (Signed)
[facility] EMERGENCY DEPARTMENT Provider Note   CSN: [id number] Arrival date & time: 01/16/22  1143     History  Chief Complaint  Patient presents with   Dental Pain    Jeremy Nichols is a 41 y.o. male who presents to the Emergency Department complaining of right upper dental pain onset 5-6 days. No dentist at this time. He has associated mild facial swelling. Denies shortness of breath. Allergies to tramadol.     The history is provided by the patient. No language interpreter was used.       Home Medications Prior to Admission medications   Medication Sig Start Date End Date Taking? Authorizing Provider  penicillin v potassium (VEETID) 500 MG tablet Take 1 tablet (500 mg total) by mouth 4 (four) times daily for 7 days. 01/16/22 01/23/22 Yes Issam Carlyon A, PA-C  amoxicillin-clavulanate (AUGMENTIN) 875-125 MG tablet Take 1 tablet by mouth 2 (two) times daily. One po bid x 7 days 07/08/20   Dartha Lodge, PA-C  cyclobenzaprine (FLEXERIL) 10 MG tablet Take 1 tablet (10 mg total) by mouth 2 (two) times daily as needed for muscle spasms. 06/06/18   Linwood Dibbles, MD  diclofenac (VOLTAREN) 50 MG EC tablet Take 1 tablet (50 mg total) by mouth 2 (two) times daily. 01/24/18   Elson Areas, PA-C  HYDROcodone-acetaminophen (NORCO) 5-325 MG tablet Take 1-2 tablets by mouth every 6 (six) hours as needed. 03/26/18   Geoffery Lyons, MD  indomethacin (INDOCIN) 25 MG capsule Take 1 capsule (25 mg total) by mouth 3 (three) times daily as needed. 03/26/18   Geoffery Lyons, MD  naproxen (NAPROSYN) 375 MG tablet Take 1 tablet (375 mg total) by mouth 2 (two) times daily with a meal. 06/22/20   Arthor Captain, PA-C  naproxen (NAPROSYN) 500 MG tablet Take 1 tablet (500 mg total) by mouth 2 (two) times daily with a meal. As needed for pain 06/06/18   Linwood Dibbles, MD  oxyCODONE (ROXICODONE) 5 MG immediate release tablet Take 0.5-1 tablets (2.5-5 mg total) by mouth every 6 (six) hours as needed for severe pain.  06/22/20   Arthor Captain, PA-C      Allergies    Tramadol hcl    Review of Systems   Review of Systems  All other systems reviewed and are negative.   Physical Exam Updated Vital Signs BP (!) 167/111 (BP Location: Right Arm)   Pulse (!) 107   Temp 98 F (36.7 C) (Oral)   Resp 18   Ht 6' (1.829 m)   Wt 108.9 kg   SpO2 96%   BMI 32.55 kg/m  Physical Exam Vitals and nursing note reviewed.  Constitutional:      General: He is not in acute distress.    Appearance: He is not ill-appearing.  HENT:     Head: Normocephalic and atraumatic.     Right Ear: External ear normal.     Left Ear: External ear normal.     Nose: Nose normal.     Mouth/Throat:     Mouth: Mucous membranes are moist. Mucous membranes are not dry.     Dentition: Abnormal dentition. Dental tenderness and dental caries present.     Tongue: Tongue does not deviate from midline.     Pharynx: Oropharynx is clear. Uvula midline. No oropharyngeal exudate or posterior oropharyngeal erythema.     Tonsils: No tonsillar exudate or tonsillar abscesses.     Comments: Multiple dental caries noted throughout. Tenderness to palpation to right  upper gum line. No fluctuance noted. No trismus. No retropharyngeal abscess. No peritonsillar abscess noted. Uvula midline. Eyes:     Extraocular Movements: Extraocular movements intact.  Cardiovascular:     Rate and Rhythm: Normal rate and regular rhythm.     Pulses: Normal pulses.     Heart sounds: Normal heart sounds.  Pulmonary:     Effort: Pulmonary effort is normal. No respiratory distress.     Breath sounds: Normal breath sounds.  Abdominal:     General: Bowel sounds are normal. There is no distension.     Palpations: Abdomen is soft. There is no mass.     Tenderness: There is no abdominal tenderness.  Musculoskeletal:        General: Normal range of motion.     Cervical back: Neck supple.  Lymphadenopathy:     Head:     Right side of head: No submental, submandibular,  tonsillar, preauricular or posterior auricular adenopathy.     Left side of head: No submental, submandibular, tonsillar, preauricular or posterior auricular adenopathy.     Cervical: No cervical adenopathy.  Skin:    General: Skin is warm and dry.     Findings: No rash.  Neurological:     Mental Status: He is alert.  Psychiatric:        Behavior: Behavior normal.     ED Results / Procedures / Treatments   Labs (all labs ordered are listed, but only abnormal results are displayed) Labs Reviewed - No data to display  EKG None  Radiology No results found.  Procedures Procedures    Medications Ordered in ED Medications  oxyCODONE-acetaminophen (PERCOCET/ROXICET) 5-325 MG per tablet 1 tablet (has no administration in time range)    ED Course/ Medical Decision Making/ A&P                           Medical Decision Making  Patient presents to the ED with right upper dental pain onset 5-6 days.  Patient does not have a dentist.  Vital signs, pt afebrile.  On exam patient with Multiple dental caries noted throughout. Tenderness to palpation to right upper gum line. No fluctuance noted. No trismus. No retropharyngeal abscess. No peritonsillar abscess noted. Uvula midline. Differential diagnosis includes pharyngeal abscess, dental abscess, Ludwig's angina.   Medications:  I ordered medication including percocet for pain management Reevaluation of the patient after these medicines and interventions, I reevaluated the patient and found that they have improved I have reviewed the patients home medicines and have made adjustments as needed  Disposition: Patient presentation suspicious for dentalgia.  No abscess requiring immediate incision and drainage.  Exam not concerning for Ludwig's angina or pharyngeal abscess. After consideration of the diagnostic results and the patients response to treatment, I feel that the patient would benefit from Discharge home. Will treat with penicillin  prescription.  Provided resource guide for dentists in the area, patient structured to follow-up with a dentist. Supportive care measures and strict return precautions discussed with patient at bedside. Pt acknowledges and verbalizes understanding. Pt appears safe for discharge. Follow up as indicated in discharge paperwork.  This chart was dictated using voice recognition software, Dragon. Despite the best efforts of this provider to proofread and correct errors, errors may still occur which can change documentation meaning.  Final Clinical Impression(s) / ED Diagnoses Final diagnoses:  Pain due to dental caries    Rx / DC Orders ED Discharge Orders  Ordered    penicillin v potassium (VEETID) 500 MG tablet  4 times daily        01/16/22 1409              Orin Eberwein A, PA-C 01/16/22 1410    Rondel Baton, MD 01/19/22 1116

## 2022-01-16 NOTE — ED Triage Notes (Signed)
Pt presents to ED with complaints of right upper tooth pain x 3 days.

## 2022-01-25 ENCOUNTER — Encounter (HOSPITAL_COMMUNITY): Payer: Self-pay

## 2022-04-12 DIAGNOSIS — K0520 Aggressive periodontitis, unspecified: Secondary | ICD-10-CM | POA: Diagnosis not present

## 2022-04-12 DIAGNOSIS — K0889 Other specified disorders of teeth and supporting structures: Secondary | ICD-10-CM | POA: Diagnosis not present

## 2022-04-12 DIAGNOSIS — Z885 Allergy status to narcotic agent status: Secondary | ICD-10-CM | POA: Diagnosis not present

## 2022-04-12 DIAGNOSIS — Z792 Long term (current) use of antibiotics: Secondary | ICD-10-CM | POA: Diagnosis not present

## 2022-04-12 DIAGNOSIS — K029 Dental caries, unspecified: Secondary | ICD-10-CM | POA: Diagnosis not present

## 2022-04-12 DIAGNOSIS — F17210 Nicotine dependence, cigarettes, uncomplicated: Secondary | ICD-10-CM | POA: Diagnosis not present

## 2022-04-12 DIAGNOSIS — I10 Essential (primary) hypertension: Secondary | ICD-10-CM | POA: Diagnosis not present

## 2022-05-06 DIAGNOSIS — I10 Essential (primary) hypertension: Secondary | ICD-10-CM | POA: Diagnosis not present

## 2022-05-06 DIAGNOSIS — Z885 Allergy status to narcotic agent status: Secondary | ICD-10-CM | POA: Diagnosis not present

## 2022-05-06 DIAGNOSIS — R69 Illness, unspecified: Secondary | ICD-10-CM | POA: Diagnosis not present

## 2022-05-06 DIAGNOSIS — L03211 Cellulitis of face: Secondary | ICD-10-CM | POA: Diagnosis not present

## 2022-05-06 DIAGNOSIS — I7090 Unspecified atherosclerosis: Secondary | ICD-10-CM | POA: Diagnosis not present

## 2022-05-06 DIAGNOSIS — K0889 Other specified disorders of teeth and supporting structures: Secondary | ICD-10-CM | POA: Diagnosis not present

## 2022-05-06 DIAGNOSIS — R911 Solitary pulmonary nodule: Secondary | ICD-10-CM | POA: Diagnosis not present

## 2022-05-06 DIAGNOSIS — K029 Dental caries, unspecified: Secondary | ICD-10-CM | POA: Diagnosis not present

## 2022-05-06 DIAGNOSIS — K047 Periapical abscess without sinus: Secondary | ICD-10-CM | POA: Diagnosis not present

## 2022-05-08 DIAGNOSIS — R220 Localized swelling, mass and lump, head: Secondary | ICD-10-CM | POA: Diagnosis not present

## 2022-05-08 DIAGNOSIS — R69 Illness, unspecified: Secondary | ICD-10-CM | POA: Diagnosis not present

## 2022-05-08 DIAGNOSIS — K047 Periapical abscess without sinus: Secondary | ICD-10-CM | POA: Diagnosis not present

## 2022-05-08 DIAGNOSIS — R6884 Jaw pain: Secondary | ICD-10-CM | POA: Diagnosis not present

## 2022-05-08 DIAGNOSIS — Z885 Allergy status to narcotic agent status: Secondary | ICD-10-CM | POA: Diagnosis not present

## 2023-08-31 ENCOUNTER — Emergency Department (HOSPITAL_COMMUNITY)
Admission: EM | Admit: 2023-08-31 | Discharge: 2023-08-31 | Disposition: A | Discharge status: Home / Self Care | Payer: Self-pay | Attending: Emergency Medicine | Admitting: Emergency Medicine

## 2023-08-31 ENCOUNTER — Encounter (HOSPITAL_COMMUNITY): Payer: Self-pay

## 2023-08-31 ENCOUNTER — Emergency Department (HOSPITAL_COMMUNITY): Payer: Self-pay

## 2023-08-31 ENCOUNTER — Other Ambulatory Visit: Payer: Self-pay

## 2023-08-31 DIAGNOSIS — S90512A Abrasion, left ankle, initial encounter: Secondary | ICD-10-CM | POA: Insufficient documentation

## 2023-08-31 DIAGNOSIS — S9002XA Contusion of left ankle, initial encounter: Secondary | ICD-10-CM

## 2023-08-31 DIAGNOSIS — W228XXA Striking against or struck by other objects, initial encounter: Secondary | ICD-10-CM | POA: Insufficient documentation

## 2023-08-31 MED ORDER — KETOROLAC TROMETHAMINE 15 MG/ML IJ SOLN
15.0000 mg | Freq: Once | INTRAMUSCULAR | Status: AC
  Administered 2023-08-31: 15 mg via INTRAMUSCULAR
  Filled 2023-08-31: qty 1

## 2023-08-31 NOTE — Discharge Instructions (Addendum)
 You are seen in the ER for a left ankle injury.  You were struck by a sledgehammer.  Fortunately there is no sign of a fracture or dislocation on your x-ray.  Use the Ace wrap, crutches, keep your ankle elevated above your heart level.  Use ice for 15 minutes at a time several times per day to help with pain and swelling as well.  You can gradually start bearing weight as tolerated.  If you are not having any improvement and not able to walk on this at all you should follow-up closely for repeat evaluation.  Come back to the ER for new or worsening symptoms.  Keep the abrasion clean and dry.

## 2023-08-31 NOTE — ED Triage Notes (Signed)
 Pt accidentally hit left ankle with sledge hammer at work. Obvious swelling

## 2023-08-31 NOTE — ED Provider Notes (Signed)
 [place] [facility]PARTMENT AT [facility] Provider Note   CSN: [id number] Arrival date & time: 08/31/23  1906     Patient presents with: Ankle Pain   Jeremy Nichols is a 43 y.o. male.  He has history of kidney stones, peptic ulcer, dental pain.  Presents to the ER today for evaluation of left ankle pain and swelling.  He states he was changing a tire on a tractor, hit it with a sledgehammer and it struck the medial aspect of the left ankle, is not having pain and swelling since that time.  No numbness or tingling.    Ankle Pain      Prior to Admission medications   Medication Sig Start Date End Date Taking? Authorizing Provider  amoxicillin -clavulanate (AUGMENTIN ) 875-125 MG tablet Take 1 tablet by mouth 2 (two) times daily. One po bid x 7 days 07/08/20   Everlyn Hockey, PA-C  cyclobenzaprine  (FLEXERIL ) 10 MG tablet Take 1 tablet (10 mg total) by mouth 2 (two) times daily as needed for muscle spasms. 06/06/18   Trish Furl, MD  diclofenac  (VOLTAREN ) 50 MG EC tablet Take 1 tablet (50 mg total) by mouth 2 (two) times daily. 01/24/18   Sofia, Leslie K, PA-C  HYDROcodone -acetaminophen  (NORCO) 5-325 MG tablet Take 1-2 tablets by mouth every 6 (six) hours as needed. 03/26/18   Orvilla Blander, MD  indomethacin  (INDOCIN ) 25 MG capsule Take 1 capsule (25 mg total) by mouth 3 (three) times daily as needed. 03/26/18   Orvilla Blander, MD  naproxen  (NAPROSYN ) 375 MG tablet Take 1 tablet (375 mg total) by mouth 2 (two) times daily with a meal. 06/22/20   Harris, Abigail, PA-C  naproxen  (NAPROSYN ) 500 MG tablet Take 1 tablet (500 mg total) by mouth 2 (two) times daily with a meal. As needed for pain 06/06/18   Trish Furl, MD  oxyCODONE  (ROXICODONE ) 5 MG immediate release tablet Take 0.5-1 tablets (2.5-5 mg total) by mouth every 6 (six) hours as needed for severe pain. 06/22/20   Harris, Abigail, PA-C    Allergies: Tramadol hcl    Review of Systems  Updated Vital Signs BP (!) 152/101   Pulse  (!) 101   Temp 98.5 F (36.9 C)   Resp 18   Ht 6' (1.829 m)   Wt 95.3 kg   SpO2 97%   BMI 28.48 kg/m   Physical Exam Vitals and nursing note reviewed.  Constitutional:      General: He is not in acute distress.    Appearance: He is well-developed.  HENT:     Head: Normocephalic and atraumatic.     Mouth/Throat:     Mouth: Mucous membranes are moist.   Eyes:     Extraocular Movements: Extraocular movements intact.     Conjunctiva/sclera: Conjunctivae normal.     Pupils: Pupils are equal, round, and reactive to light.    Cardiovascular:     Rate and Rhythm: Normal rate and regular rhythm.     Heart sounds: No murmur heard. Pulmonary:     Effort: Pulmonary effort is normal. No respiratory distress.     Breath sounds: Normal breath sounds.  Abdominal:     Palpations: Abdomen is soft.     Tenderness: There is no abdominal tenderness.   Musculoskeletal:        General: Swelling present.     Cervical back: Neck supple.     Comments: Swelling to medial aspect of left ankle with mild abrasion overlying the medial malleolus.  DP and PT pulses intact to left foot.  Sensation intact in left foot.  No tenderness over foot, left calf, left knee or hip.   Skin:    General: Skin is warm and dry.     Capillary Refill: Capillary refill takes less than 2 seconds.   Neurological:     General: No focal deficit present.     Mental Status: He is alert and oriented to person, place, and time.   Psychiatric:        Mood and Affect: Mood normal.     (all labs ordered are listed, but only abnormal results are displayed) Labs Reviewed - No data to display  EKG: None  Radiology: DG Ankle Complete Left Result Date: 08/31/2023 CLINICAL DATA:  Status post trauma. EXAM: LEFT ANKLE COMPLETE - 3+ VIEW COMPARISON:  None Available. FINDINGS: There is no evidence of fracture, dislocation, or joint effusion. There is no evidence of arthropathy or other focal bone abnormality. Soft tissues are  unremarkable. IMPRESSION: Negative. Electronically Signed   By: Virgle Grime M.D.   On: 08/31/2023 19:43     Procedures   Medications Ordered in the ED - No data to display                                  Medical Decision Making Differential diagnosis  includes but limited to fracture, sprain, dislocation, contusion, other  ED course: Patient here with swelling over the left medial ankle after being struck by sledgehammer accidentally at work when he swung it to hit a tire.  He has swellin but no tenderness in this area without crepitus.  X-rays show no fracture or dislocation, will treat with analgesics, Ace wrap, crutches and follow-up with PCP.  Discussed with patient risk of occult fracture and if not improving with supportive care of the next week he needs to follow-up with PCP or orthopedics for further evaluation.  Amount and/or Complexity of Data Reviewed Radiology: ordered and independent interpretation performed.    Details: X-ray left ankle shows no fracture, no malalignment, no soft tissue abnormalities.  Agree with radiology read  Risk Prescription drug management.        Final diagnoses:  None    ED Discharge Orders     None          Olliver Nieves 08/31/23 2052    Cheyenne Cotta, MD 09/01/23 1123

## 2023-08-31 NOTE — ED Notes (Signed)
 Pt/family received d/c paperwork at this time. After going over the paperwork any questions, comments, or concerns were answered to the best of this nurse's knowledge. The pt/family verbally acknowledged the teachings/instructions.

## 2023-09-05 ENCOUNTER — Emergency Department (HOSPITAL_COMMUNITY): Payer: Self-pay

## 2023-09-05 ENCOUNTER — Encounter (HOSPITAL_COMMUNITY): Payer: Self-pay

## 2023-09-05 ENCOUNTER — Emergency Department (HOSPITAL_COMMUNITY)
Admission: EM | Admit: 2023-09-05 | Discharge: 2023-09-05 | Disposition: A | Discharge status: Home / Self Care | Payer: Self-pay

## 2023-09-05 ENCOUNTER — Other Ambulatory Visit: Payer: Self-pay

## 2023-09-05 DIAGNOSIS — K529 Noninfective gastroenteritis and colitis, unspecified: Secondary | ICD-10-CM | POA: Insufficient documentation

## 2023-09-05 LAB — CBC WITH DIFFERENTIAL/PLATELET
Abs Immature Granulocytes: 0.02 10*3/uL (ref 0.00–0.07)
Basophils Absolute: 0 10*3/uL (ref 0.0–0.1)
Basophils Relative: 0 %
Eosinophils Absolute: 0.1 10*3/uL (ref 0.0–0.5)
Eosinophils Relative: 1 %
HCT: 48.2 % (ref 39.0–52.0)
Hemoglobin: 16.7 g/dL (ref 13.0–17.0)
Immature Granulocytes: 0 %
Lymphocytes Relative: 24 %
Lymphs Abs: 2.4 10*3/uL (ref 0.7–4.0)
MCH: 32.6 pg (ref 26.0–34.0)
MCHC: 34.6 g/dL (ref 30.0–36.0)
MCV: 94.1 fL (ref 80.0–100.0)
Monocytes Absolute: 0.7 10*3/uL (ref 0.1–1.0)
Monocytes Relative: 7 %
Neutro Abs: 6.7 10*3/uL (ref 1.7–7.7)
Neutrophils Relative %: 68 %
Platelets: 311 10*3/uL (ref 150–400)
RBC: 5.12 MIL/uL (ref 4.22–5.81)
RDW: 11.7 % (ref 11.5–15.5)
WBC: 9.8 10*3/uL (ref 4.0–10.5)
nRBC: 0 % (ref 0.0–0.2)

## 2023-09-05 LAB — COMPREHENSIVE METABOLIC PANEL WITH GFR
ALT: 19 U/L (ref 0–44)
AST: 18 U/L (ref 15–41)
Albumin: 4.1 g/dL (ref 3.5–5.0)
Alkaline Phosphatase: 67 U/L (ref 38–126)
Anion gap: 11 (ref 5–15)
BUN: 10 mg/dL (ref 6–20)
CO2: 20 mmol/L — ABNORMAL LOW (ref 22–32)
Calcium: 9.1 mg/dL (ref 8.9–10.3)
Chloride: 106 mmol/L (ref 98–111)
Creatinine, Ser: 0.9 mg/dL (ref 0.61–1.24)
GFR, Estimated: >60 mL/min (ref >60)
Glucose, Bld: 125 mg/dL — ABNORMAL HIGH (ref 70–99)
Potassium: 3.8 mmol/L (ref 3.5–5.1)
Sodium: 137 mmol/L (ref 135–145)
Total Bilirubin: 0.7 mg/dL (ref 0.0–1.2)
Total Protein: 7.6 g/dL (ref 6.5–8.1)

## 2023-09-05 LAB — URINALYSIS, ROUTINE W REFLEX MICROSCOPIC
Bacteria, UA: NONE SEEN
Bilirubin Urine: NEGATIVE
Glucose, UA: NEGATIVE mg/dL
Hgb urine dipstick: NEGATIVE
Ketones, ur: NEGATIVE mg/dL
Leukocytes,Ua: NEGATIVE
Nitrite: NEGATIVE
Protein, ur: 30 mg/dL — AB
Specific Gravity, Urine: 1.02 (ref 1.005–1.030)
pH: 5 (ref 5.0–8.0)

## 2023-09-05 LAB — LIPASE, BLOOD: Lipase: 30 U/L (ref 11–51)

## 2023-09-05 MED ORDER — ONDANSETRON HCL 4 MG/2ML IJ SOLN
4.0000 mg | Freq: Once | INTRAMUSCULAR | Status: AC
  Administered 2023-09-05: 4 mg via INTRAVENOUS
  Filled 2023-09-05: qty 2

## 2023-09-05 MED ORDER — KETOROLAC TROMETHAMINE 15 MG/ML IJ SOLN
15.0000 mg | Freq: Once | INTRAMUSCULAR | Status: DC
  Filled 2023-09-05: qty 1

## 2023-09-05 MED ORDER — HYDROMORPHONE HCL 1 MG/ML IJ SOLN
0.5000 mg | Freq: Once | INTRAMUSCULAR | Status: AC
  Administered 2023-09-05: 0.5 mg via INTRAVENOUS
  Filled 2023-09-05: qty 0.5

## 2023-09-05 MED ORDER — SODIUM CHLORIDE 0.9 % IV BOLUS
1000.0000 mL | Freq: Once | INTRAVENOUS | Status: AC
  Administered 2023-09-05: 1000 mL via INTRAVENOUS

## 2023-09-05 NOTE — Discharge Instructions (Addendum)
   IMPRESSION:  1. Mild pericolonic inflammatory/edematous changes extending from  the splenic flexure through the descending colon, new since  previous, consistent with colitis.   This is likely what is causing some of your pain.  This is usually viral or bacterial in nature.  This should pass on its own.  If you continue to have discomfort in this area then please follow-up with gastroenterology.  They may need to perform a colonoscopy for further testing to make sure that this is not inflammatory in nature that needs further medication.  Please follow-up with them if your pain continues.   2. Bilateral nephrolithiasis without hydronephrosis.  3. 2.1 cm low-attenuation region upper pole left kidney, more  conspicuous than on prior exam, possibly cyst but incompletely  characterized. Consider follow-up ultrasound.   You have an abnormality in one of your kidneys.  Please follow-up with your primary care physician for ultrasound of this area.  You also has some kidney stones in the kidneys but not causing obstruction.    Stay well-hydrated.  If you start having fever with worsening abdominal pain please come to ED.

## 2023-09-05 NOTE — ED Provider Notes (Signed)
 [place] EMERGENCY DEPARTMENT AT [facility] Provider Note   CSN: [id number] Arrival date & time: 09/05/23  1130     Patient presents with: Flank Pain   Jeremy Nichols is a 43 y.o. male.    Flank Pain   Presents With right flank pain as well as right abdominal pain.  Patient states has had history of kidney stones in the past.  Feels very similar.  Patient states that for the past day has been having discomfort in the area.  No fever no chills.  Endorses some vomiting due to pain.  Normal.  Patient states that for started on his right flank area now is migrated to his right lower abdominal area.  No obvious hematuria.  Previous abdominal surgeries.  Took tylenol  for pain w/o relief.    Previous medical history reviewed : Patient was seen in 2020 because of flank pain.  Scan showed intrarenal stones but no evidence of ureteral stone.    Prior to Admission medications   Medication Sig Start Date End Date Taking? Authorizing Provider  amoxicillin -clavulanate (AUGMENTIN ) 875-125 MG tablet Take 1 tablet by mouth 2 (two) times daily. One po bid x 7 days 07/08/20   Everlyn Hockey, PA-C  cyclobenzaprine  (FLEXERIL ) 10 MG tablet Take 1 tablet (10 mg total) by mouth 2 (two) times daily as needed for muscle spasms. 06/06/18   Trish Furl, MD  diclofenac  (VOLTAREN ) 50 MG EC tablet Take 1 tablet (50 mg total) by mouth 2 (two) times daily. 01/24/18   Sofia, Leslie K, PA-C  HYDROcodone -acetaminophen  (NORCO) 5-325 MG tablet Take 1-2 tablets by mouth every 6 (six) hours as needed. 03/26/18   Orvilla Blander, MD  indomethacin  (INDOCIN ) 25 MG capsule Take 1 capsule (25 mg total) by mouth 3 (three) times daily as needed. 03/26/18   Orvilla Blander, MD  naproxen  (NAPROSYN ) 375 MG tablet Take 1 tablet (375 mg total) by mouth 2 (two) times daily with a meal. 06/22/20   Harris, Abigail, PA-C  naproxen  (NAPROSYN ) 500 MG tablet Take 1 tablet (500 mg total) by mouth 2 (two) times daily with a meal. As needed for  pain 06/06/18   Trish Furl, MD  oxyCODONE  (ROXICODONE ) 5 MG immediate release tablet Take 0.5-1 tablets (2.5-5 mg total) by mouth every 6 (six) hours as needed for severe pain. 06/22/20   Harris, Abigail, PA-C    Allergies: Tramadol hcl    Review of Systems  Genitourinary:  Positive for flank pain.    Updated Vital Signs BP (!) 154/100   Pulse 73   Temp 98 F (36.7 C) (Oral)   Resp 18   Ht 6' 1 (1.854 m)   Wt 95.3 kg   SpO2 100%   BMI 27.71 kg/m   Physical Exam Vitals and nursing note reviewed.  Constitutional:      General: He is not in acute distress.    Appearance: He is well-developed.  HENT:     Head: Normocephalic and atraumatic.   Eyes:     Conjunctiva/sclera: Conjunctivae normal.    Cardiovascular:     Rate and Rhythm: Normal rate and regular rhythm.     Heart sounds: No murmur heard. Pulmonary:     Effort: Pulmonary effort is normal. No respiratory distress.     Breath sounds: Normal breath sounds.  Abdominal:     Palpations: Abdomen is soft.     Tenderness: There is no abdominal tenderness.   Musculoskeletal:        General: No  swelling.     Cervical back: Neck supple.   Skin:    General: Skin is warm and dry.     Capillary Refill: Capillary refill takes less than 2 seconds.   Neurological:     Mental Status: He is alert.   Psychiatric:        Mood and Affect: Mood normal.     (all labs ordered are listed, but only abnormal results are displayed) Labs Reviewed  URINALYSIS, ROUTINE W REFLEX MICROSCOPIC - Abnormal; Notable for the following components:      Result Value   Protein, ur 30 (*)    All other components within normal limits  COMPREHENSIVE METABOLIC PANEL WITH GFR - Abnormal; Notable for the following components:   CO2 20 (*)    Glucose, Bld 125 (*)    All other components within normal limits  CBC WITH DIFFERENTIAL/PLATELET  LIPASE, BLOOD    EKG: None  Radiology: CT Renal Stone Study Result Date: 09/05/2023 CLINICAL DATA:   Flank pain, history of nephrolithiasis EXAM: CT ABDOMEN AND PELVIS WITHOUT CONTRAST TECHNIQUE: Multidetector CT imaging of the abdomen and pelvis was performed following the standard protocol without IV contrast. RADIATION DOSE REDUCTION: This exam was performed according to the departmental dose-optimization program which includes automated exposure control, adjustment of the mA and/or kV according to patient size and/or use of iterative reconstruction technique. COMPARISON:  06/05/2018 FINDINGS: Lower chest: No pleural or pericardial effusion. Visualized lung bases clear. Hepatobiliary: 1.2 cm low-attenuation lesion in segment for a, stable since 06/05/2018 consistent with benign process probably cyst. No worrisome liver lesion or biliary ductal dilatation. Gallbladder incompletely distended. No calcified gallstones. Pancreas: Unremarkable. No pancreatic ductal dilatation or surrounding inflammatory changes. Spleen: Normal in size without focal abnormality. Adrenals/Urinary Tract: No adrenal mass. Kidneys normal in size and contour. No hydronephrosis. Bilateral urolithiasis, with at least 3 1 mm calculi in the upper pole right renal collecting system, and a 3 mm calculus posteriorly in the periphery of the left renal collecting system. 2.1 cm low-attenuation region in the upper pole left kidney, more conspicuous than on prior exam, possibly cyst but incompletely characterized. Urinary bladder is nondistended. Ureters decompressed. Stomach/Bowel: Stomach is partially distended, without acute finding. Small bowel decompressed. Normal appendix. The colon is nondistended. There are mild pericolonic inflammatory/edematous changes extending from the splenic flexure through the descending colon, new since previous. Sigmoid colon and rectum are unremarkable. No evidence of perforation or abscess. Vascular/Lymphatic: Mild scattered calcified aortoiliac atheromatous plaque without AAA. No abdominal or pelvic adenopathy.  Reproductive: Prostate is unremarkable. Other: No ascites.  No free air. Musculoskeletal: No fracture or worrisome lesion. IMPRESSION: 1. Mild pericolonic inflammatory/edematous changes extending from the splenic flexure through the descending colon, new since previous, consistent with colitis. 2. Bilateral nephrolithiasis without hydronephrosis. 3. 2.1 cm low-attenuation region upper pole left kidney, more conspicuous than on prior exam, possibly cyst but incompletely characterized. Consider follow-up ultrasound. 4.  Aortic Atherosclerosis (ICD10-I70.0). Electronically Signed   By: Nicoletta Barrier M.D.   On: 09/05/2023 14:58     Procedures   Medications Ordered in the ED  sodium chloride  0.9 % bolus 1,000 mL (0 mLs Intravenous Stopped 09/05/23 1330)  HYDROmorphone  (DILAUDID ) injection 0.5 mg (0.5 mg Intravenous Given 09/05/23 1208)  ondansetron  (ZOFRAN ) injection 4 mg (4 mg Intravenous Given 09/05/23 1208)  Medical Decision Making Amount and/or Complexity of Data Reviewed Labs: ordered. Radiology: ordered.  Risk Prescription drug management.   Presents With right flank pain as well as right abdominal pain.  Patient states has had history of kidney stones in the past.  Feels very similar.  Patient states that for the past day has been having discomfort in the area.  No fever no chills.  Endorses some vomiting due to pain.  Normal.  Patient states that for started on his right flank area now is migrated to his right lower abdominal area.  No obvious hematuria.  Previous abdominal surgeries.  Took tylenol  for pain w/o relief.      Chart review by me: Previous medical history reviewed : Patient was seen in 2020 because of flank pain.  Scan showed intrarenal stones but no evidence of ureteral stone.     On exam, patient hemodynamically stable.  ANO x 3 GCS 15.  Maps appropriate.  Slightly hypertensive.  Afebrile.    did have pain to palpation in the right  flank and bilateral lower abdominal area.  Laboratory workup largely unremarkable.  No significant leukocytosis.  LFTs normal.  Slight acidosis likely related to some GI losses.  Received a liter of fluid here in the ED.  Also obtain CT scan renal stone protocol.  Does show some evidence of colitis.  Likely driving patient's discomfort.  No evidence of any kind of obstruction in terms of kidney stones.  UA unremarkable.  No UTI.  No concerns for pyelonephritis.  Only mild atherosclerotic plaque.  Given patient is afebrile, no anion gap did be concern for lactic acidosis, my concern for any kind of ischemic disease causing this colitis is very low.  Patient to follow-up with gastroenterology if pain continues.  Strict return precautions for any blood in stool or fever.  Patient also follow-up with PCP regarding the low-attenuation region of the left kidney.       Final diagnoses:  Colitis    ED Discharge Orders     None          Spero Dye, MD 09/05/23 1515

## 2023-09-05 NOTE — ED Triage Notes (Signed)
 Pt arrived via POV c/o flank pain and reports Hx of kidney stones.
